# Patient Record
Sex: Male | Born: 1955
Health system: Southern US, Community
[De-identification: ages and names within clinical notes are randomized; demographics above are authoritative.]

## PROBLEM LIST (undated history)

## (undated) DIAGNOSIS — B019 Varicella without complication: Secondary | ICD-10-CM

## (undated) DIAGNOSIS — T7840XA Allergy, unspecified, initial encounter: Secondary | ICD-10-CM

## (undated) HISTORY — DX: Allergy, unspecified, initial encounter: T78.40XA

## (undated) HISTORY — DX: Varicella without complication: B01.9

## (undated) HISTORY — PX: WRIST SURGERY: SHX841

---

## 2009-06-18 ENCOUNTER — Ambulatory Visit: Payer: Self-pay | Admitting: Gastroenterology

## 2009-06-18 LAB — HM COLONOSCOPY

## 2013-08-20 ENCOUNTER — Ambulatory Visit: Payer: Self-pay | Admitting: Podiatry

## 2013-08-29 ENCOUNTER — Ambulatory Visit: Payer: Self-pay | Admitting: Podiatry

## 2014-09-25 DIAGNOSIS — S46211A Strain of muscle, fascia and tendon of other parts of biceps, right arm, initial encounter: Secondary | ICD-10-CM | POA: Insufficient documentation

## 2016-03-09 ENCOUNTER — Ambulatory Visit (INDEPENDENT_AMBULATORY_CARE_PROVIDER_SITE_OTHER): Payer: BLUE CROSS/BLUE SHIELD | Admitting: Family Medicine

## 2016-03-09 ENCOUNTER — Encounter: Payer: Self-pay | Admitting: Family Medicine

## 2016-03-09 VITALS — BP 138/76 | HR 94 | Temp 98.2°F | Ht 70.25 in | Wt 204.0 lb

## 2016-03-09 DIAGNOSIS — M25511 Pain in right shoulder: Secondary | ICD-10-CM

## 2016-03-09 DIAGNOSIS — Z130 Encounter for screening for diseases of the blood and blood-forming organs and certain disorders involving the immune mechanism: Secondary | ICD-10-CM | POA: Diagnosis not present

## 2016-03-09 DIAGNOSIS — Z13220 Encounter for screening for lipoid disorders: Secondary | ICD-10-CM | POA: Diagnosis not present

## 2016-03-09 DIAGNOSIS — M75101 Unspecified rotator cuff tear or rupture of right shoulder, not specified as traumatic: Secondary | ICD-10-CM | POA: Diagnosis not present

## 2016-03-09 DIAGNOSIS — IMO0001 Reserved for inherently not codable concepts without codable children: Secondary | ICD-10-CM

## 2016-03-09 DIAGNOSIS — Z0001 Encounter for general adult medical examination with abnormal findings: Secondary | ICD-10-CM

## 2016-03-09 DIAGNOSIS — Z125 Encounter for screening for malignant neoplasm of prostate: Secondary | ICD-10-CM

## 2016-03-09 DIAGNOSIS — E663 Overweight: Secondary | ICD-10-CM

## 2016-03-09 DIAGNOSIS — Z23 Encounter for immunization: Secondary | ICD-10-CM

## 2016-03-09 DIAGNOSIS — Z1211 Encounter for screening for malignant neoplasm of colon: Secondary | ICD-10-CM

## 2016-03-09 DIAGNOSIS — R030 Elevated blood-pressure reading, without diagnosis of hypertension: Secondary | ICD-10-CM | POA: Diagnosis not present

## 2016-03-09 LAB — COMPREHENSIVE METABOLIC PANEL
ALBUMIN: 4.3 g/dL (ref 3.5–5.2)
ALK PHOS: 60 U/L (ref 39–117)
ALT: 24 U/L (ref 0–53)
AST: 17 U/L (ref 0–37)
BUN: 24 mg/dL — AB (ref 6–23)
CALCIUM: 9.4 mg/dL (ref 8.4–10.5)
CO2: 28 mEq/L (ref 19–32)
Chloride: 108 mEq/L (ref 96–112)
Creatinine, Ser: 1.04 mg/dL (ref 0.40–1.50)
GFR: 77.34 mL/min (ref >60.00)
Glucose, Bld: 79 mg/dL (ref 70–99)
POTASSIUM: 4.5 meq/L (ref 3.5–5.1)
SODIUM: 140 meq/L (ref 135–145)
TOTAL PROTEIN: 6.5 g/dL (ref 6.0–8.3)
Total Bilirubin: 0.4 mg/dL (ref 0.2–1.2)

## 2016-03-09 LAB — LIPID PANEL
CHOLESTEROL: 182 mg/dL (ref 0–200)
HDL: 28.9 mg/dL — ABNORMAL LOW (ref >39.00)
NonHDL: 153.21
TRIGLYCERIDES: 226 mg/dL — AB (ref 0.0–149.0)
Total CHOL/HDL Ratio: 6
VLDL: 45.2 mg/dL — AB (ref 0.0–40.0)

## 2016-03-09 LAB — CBC
HEMATOCRIT: 44.3 % (ref 39.0–52.0)
HEMOGLOBIN: 15.2 g/dL (ref 13.0–17.0)
MCHC: 34.4 g/dL (ref 30.0–36.0)
MCV: 94.4 fl (ref 78.0–100.0)
PLATELETS: 234 10*3/uL (ref 150.0–400.0)
RBC: 4.69 Mil/uL (ref 4.22–5.81)
RDW: 12.9 % (ref 11.5–15.5)
WBC: 10.3 10*3/uL (ref 4.0–10.5)

## 2016-03-09 LAB — HEMOGLOBIN A1C: HEMOGLOBIN A1C: 5.5 % (ref 4.6–6.5)

## 2016-03-09 LAB — PSA: PSA: 0.49 ng/mL (ref 0.10–4.00)

## 2016-03-09 LAB — LDL CHOLESTEROL, DIRECT: LDL DIRECT: 122 mg/dL

## 2016-03-09 MED ORDER — METHYLPREDNISOLONE ACETATE 80 MG/ML IJ SUSP
80.0000 mg | Freq: Once | INTRAMUSCULAR | Status: AC
  Administered 2016-03-09: 80 mg via INTRA_ARTICULAR

## 2016-03-09 NOTE — Patient Instructions (Addendum)
We will call with your lab results.  Your BP is okay at this time.  Follow up annually or sooner if needed.  Health Maintenance, Male A healthy lifestyle and preventative care can promote health and wellness.  Maintain regular health, dental, and eye exams.  Eat a healthy diet. Foods like vegetables, fruits, whole grains, low-fat dairy products, and lean protein foods contain the nutrients you need and are low in calories. Decrease your intake of foods high in solid fats, added sugars, and salt. Get information about a proper diet from your health care provider, if necessary.  Regular physical exercise is one of the most important things you can do for your health. Most adults should get at least 150 minutes of moderate-intensity exercise (any activity that increases your heart rate and causes you to sweat) each week. In addition, most adults need muscle-strengthening exercises on 2 or more days a week.   Maintain a healthy weight. The body mass index (BMI) is a screening tool to identify possible weight problems. It provides an estimate of body fat based on height and weight. Your health care provider can find your BMI and can help you achieve or maintain a healthy weight. For males 20 years and older:  A BMI below 18.5 is considered underweight.  A BMI of 18.5 to 24.9 is normal.  A BMI of 25 to 29.9 is considered overweight.  A BMI of 30 and above is considered obese.  Maintain normal blood lipids and cholesterol by exercising and minimizing your intake of saturated fat. Eat a balanced diet with plenty of fruits and vegetables. Blood tests for lipids and cholesterol should begin at age 30 and be repeated every 5 years. If your lipid or cholesterol levels are high, you are over age 63, or you are at high risk for heart disease, you may need your cholesterol levels checked more frequently.Ongoing high lipid and cholesterol levels should be treated with medicines if diet and exercise are  not working.  If you smoke, find out from your health care provider how to quit. If you do not use tobacco, do not start.  Lung cancer screening is recommended for adults aged 4-80 years who are at high risk for developing lung cancer because of a history of smoking. A yearly low-dose CT scan of the lungs is recommended for people who have at least a 30-pack-year history of smoking and are current smokers or have quit within the past 15 years. A pack year of smoking is smoking an average of 1 pack of cigarettes a day for 1 year (for example, a 30-pack-year history of smoking could mean smoking 1 pack a day for 30 years or 2 packs a day for 15 years). Yearly screening should continue until the smoker has stopped smoking for at least 15 years. Yearly screening should be stopped for people who develop a health problem that would prevent them from having lung cancer treatment.  If you choose to drink alcohol, do not have more than 2 drinks per day. One drink is considered to be 12 oz (360 mL) of beer, 5 oz (150 mL) of wine, or 1.5 oz (45 mL) of liquor.  Avoid the use of street drugs. Do not share needles with anyone. Ask for help if you need support or instructions about stopping the use of drugs.  High blood pressure causes heart disease and increases the risk of stroke. High blood pressure is more likely to develop in:  People who have blood pressure  in the end of the normal range (100-139/85-89 mm Hg).  People who are overweight or obese.  People who are African American.  If you are 88-38 years of age, have your blood pressure checked every 3-5 years. If you are 68 years of age or older, have your blood pressure checked every year. You should have your blood pressure measured twice--once when you are at a hospital or clinic, and once when you are not at a hospital or clinic. Record the average of the two measurements. To check your blood pressure when you are not at a hospital or clinic, you can  use:  An automated blood pressure machine at a pharmacy.  A home blood pressure monitor.  If you are 56-3 years old, ask your health care provider if you should take aspirin to prevent heart disease.  Diabetes screening involves taking a blood sample to check your fasting blood sugar level. This should be done once every 3 years after age 50 if you are at a normal weight and without risk factors for diabetes. Testing should be considered at a younger age or be carried out more frequently if you are overweight and have at least 1 risk factor for diabetes.  Colorectal cancer can be detected and often prevented. Most routine colorectal cancer screening begins at the age of 70 and continues through age 33. However, your health care provider may recommend screening at an earlier age if you have risk factors for colon cancer. On a yearly basis, your health care provider may provide home test kits to check for hidden blood in the stool. A small camera at the end of a tube may be used to directly examine the colon (sigmoidoscopy or colonoscopy) to detect the earliest forms of colorectal cancer. Talk to your health care provider about this at age 9 when routine screening begins. A direct exam of the colon should be repeated every 5-10 years through age 32, unless early forms of precancerous polyps or small growths are found.  People who are at an increased risk for hepatitis B should be screened for this virus. You are considered at high risk for hepatitis B if:  You were born in a country where hepatitis B occurs often. Talk with your health care provider about which countries are considered high risk.  Your parents were born in a high-risk country and you have not received a shot to protect against hepatitis B (hepatitis B vaccine).  You have HIV or AIDS.  You use needles to inject street drugs.  You live with, or have sex with, someone who has hepatitis B.  You are a man who has sex with other  men (MSM).  You get hemodialysis treatment.  You take certain medicines for conditions like cancer, organ transplantation, and autoimmune conditions.  Hepatitis C blood testing is recommended for all people born from [birth year] through 1965 and any individual with known risk factors for hepatitis C.  Healthy men should no longer receive prostate-specific antigen (PSA) blood tests as part of routine cancer screening. Talk to your health care provider about prostate cancer screening.  Testicular cancer screening is not recommended for adolescents or adult males who have no symptoms. Screening includes self-exam, a health care provider exam, and other screening tests. Consult with your health care provider about any symptoms you have or any concerns you have about testicular cancer.  Practice safe sex. Use condoms and avoid high-risk sexual practices to reduce the spread of sexually transmitted infections (STIs).  You should be screened for STIs, including gonorrhea and chlamydia if:  You are sexually active and are younger than 24 years.  You are older than 24 years, and your health care provider tells you that you are at risk for this type of infection.  Your sexual activity has changed since you were last screened, and you are at an increased risk for chlamydia or gonorrhea. Ask your health care provider if you are at risk.  If you are at risk of being infected with HIV, it is recommended that you take a prescription medicine daily to prevent HIV infection. This is called pre-exposure prophylaxis (PrEP). You are considered at risk if:  You are a man who has sex with other men (MSM).  You are a heterosexual man who is sexually active with multiple partners.  You take drugs by injection.  You are sexually active with a partner who has HIV.  Talk with your health care provider about whether you are at high risk of being infected with HIV. If you choose to begin PrEP, you should first be tested  for HIV. You should then be tested every 3 months for as long as you are taking PrEP.  Use sunscreen. Apply sunscreen liberally and repeatedly throughout the day. You should seek shade when your shadow is shorter than you. Protect yourself by wearing long sleeves, pants, a wide-brimmed hat, and sunglasses year round whenever you are outdoors.  Tell your health care provider of new moles or changes in moles, especially if there is a change in shape or color. Also, tell your health care provider if a mole is larger than the size of a pencil eraser.  A one-time screening for abdominal aortic aneurysm (AAA) and surgical repair of large AAAs by ultrasound is recommended for men aged 26-75 years who are current or former smokers.  Stay current with your vaccines (immunizations).   This information is not intended to replace advice given to you by your health care provider. Make sure you discuss any questions you have with your health care provider.   Document Released: 03/25/2008 Document Revised: 10/18/2014 Document Reviewed: 02/22/2011 Elsevier Interactive Patient Education [employer].

## 2016-03-09 NOTE — Progress Notes (Signed)
Pre visit review using our clinic review tool, if applicable. No additional management support is needed unless otherwise documented below in the visit note. 

## 2016-03-10 DIAGNOSIS — Z0000 Encounter for general adult medical examination without abnormal findings: Secondary | ICD-10-CM | POA: Insufficient documentation

## 2016-03-10 DIAGNOSIS — E663 Overweight: Secondary | ICD-10-CM | POA: Insufficient documentation

## 2016-03-10 DIAGNOSIS — M75100 Unspecified rotator cuff tear or rupture of unspecified shoulder, not specified as traumatic: Secondary | ICD-10-CM | POA: Insufficient documentation

## 2016-03-10 DIAGNOSIS — Z0001 Encounter for general adult medical examination with abnormal findings: Secondary | ICD-10-CM | POA: Insufficient documentation

## 2016-03-10 NOTE — Assessment & Plan Note (Signed)
Placing order for colonoscopy. Pneumococcal vaccine given today. Holding off on tetanus immunization given the fact that pneumococcal was given today. Screening labs today including PSA. Advised to quit smoking. Patient is not ready to quit at this time.

## 2016-03-10 NOTE — Progress Notes (Signed)
Subjective:  Patient ID: Gabriel Camacho, male    DOB: Mar 17, 1956  Age: 60 y.o. MRN: [id number]  CC: Establish care; concerned about BP; Right shoulder pain  HPI Gabriel Camacho is a 60 y.o. male presents to the clinic today to establish care.  Preventative Healthcare  Colonoscopy: In need of. Last done 10 years ago.  Immunizations  Tetanus - In need of.   Pneumococcal - In need of.  Flu - N/A at this time.  Zoster - In need of.  Prostate cancer screening: PSA today.  Hepatitis C screening - In need of.  Labs: Screening labs today.  Exercise: Reports regular exercise.  Alcohol use: No.  Smoking/tobacco use: Current smoker.  HIV testing: In need of.  Wears seat belt: Yes.   Elevated BP  Has no diagnosis of hypertension.  States she's had elevated blood pressures in the past 2  He is concerned about this given that he has an upcoming DOT physical.  Shoulder pain, right  New problem.  Recently worsened after significant activity (shoveling).  Pain is moderate in severity.  Located laterally.  Worse with certain movements.  No known relieving factors.  No recent fall, trauma, injury.  PMH, Surgical Hx, Family Hx, Social History reviewed and updated as below.  Past Medical History  Diagnosis Date  . Allergy   . Chicken pox    Past Surgical History  Procedure Laterality Date  . Wrist surgery Left    Family Hx - Does not report any family history.  Social History  Substance Use Topics  . Smoking status: Current Some Day Smoker -- 0.25 packs/day    Types: Cigarettes  . 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 Alcohol Use: No   Review of Systems  Musculoskeletal:       Shoulder pain - Right.  All other systems reviewed and are negative.  Objective:   Today's Vitals: BP 138/76 mmHg  Pulse 94  Temp(Src) 98.2 F (36.8 C) (Oral)  Ht 5' 10.25" (1.784 m)  Wt 204 lb (92.534 kg)   BMI 29.07 kg/m2  SpO2 95%  Physical Exam  Constitutional: He is oriented to person, place, and time. He appears well-developed. No distress.  HENT:  Head: Normocephalic and atraumatic.  Mouth/Throat: Oropharynx is clear and moist.  Eyes: Conjunctivae are normal. No scleral icterus.  Neck: Neck supple.  Cardiovascular: Normal rate and regular rhythm.   No murmur heard. Pulmonary/Chest: Effort normal. He has no wheezes. He has no rales.  Abdominal: Soft. He exhibits no distension. There is no tenderness. There is no rebound and no guarding.  Musculoskeletal:  Shoulder: Right Inspection reveals no abnormalities, atrophy or asymmetry. Palpation is normal with no tenderness over AC joint or bicipital groove. ROM is full in all planes. Rotator cuff strength - 4/5 supraspinatus, infraspinatus/teres minor. 5/5 subscapularis. Negative Hawkins. No painful arc and no drop arm sign. No apprehension sign  Lymphadenopathy:    He has no cervical adenopathy.  Neurological: He is alert and oriented to person, place, and time.  Skin: Skin is warm and dry. No rash noted.  Psychiatric: He has a normal mood and affect.  Vitals reviewed.  Assessment & Plan:   Problem List Items Addressed This Visit    Rotator cuff syndrome    Discussed treatment options today. Patient elected for injection. Injection was performed without difficulty.      Relevant Medications  methylPREDNISolone acetate (DEPO-MEDROL) injection 80 mg (Completed)   Overweight (BMI 25.0-29.9)   Relevant Orders   HgB A1c (Completed)   Encounter for preventative adult health care exam with abnormal findings - Primary    Placing order for colonoscopy. Pneumococcal vaccine given today. Holding off on tetanus immunization given the fact that pneumococcal was given today. Screening labs today including PSA. Advised to quit smoking. Patient is not ready to quit at this time.      Elevated BP    Elevated initially. Repeat blood  pressure was in the prehypertensive range. No indication for treatment at this time.      Relevant Orders   Comp Met (CMET) (Completed)    Other Visit Diagnoses    Screening, lipid        Relevant Orders    Lipid Profile (Completed)    Screening for deficiency anemia        Relevant Orders    CBC (Completed)    Screening for prostate cancer        Relevant Orders    PSA (Completed)    Need for prophylactic vaccination with Streptococcus pneumoniae (Pneumococcus) and Influenza vaccines        Relevant Orders    Pneumococcal polysaccharide vaccine 23-valent greater than or equal to 2yo subcutaneous/IM (Completed)    Encounter for screening colonoscopy        Relevant Orders    Ambulatory referral to Gastroenterology      Outpatient Encounter Prescriptions as of 03/09/2016  Medication Sig  . [EXPIRED] methylPREDNISolone acetate (DEPO-MEDROL) injection 80 mg    No facility-administered encounter medications on file as of 03/09/2016.   Procedure: Right Subacromial bursa injection Consent signed and scanned into record. Medication:  80 mg of Depo medrol, 4 mL of Lidocaine 1% w/o Epi. Preparation: area cleansed with alcohol. Time Out taken  Injection  Landmarks identified Above medication injected using a standard posterior approach. Patient tolerated well without bleeding or paresthesias  Patient had good range of motion of joint after injection  Follow-up: Annually or sooner PRN  [place]

## 2016-03-10 NOTE — Assessment & Plan Note (Signed)
Discussed treatment options today. Patient elected for injection. Injection was performed without difficulty.

## 2016-03-10 NOTE — Assessment & Plan Note (Signed)
Elevated initially. Repeat blood pressure was in the prehypertensive range. No indication for treatment at this time.

## 2016-03-11 ENCOUNTER — Other Ambulatory Visit: Payer: Self-pay | Admitting: Family Medicine

## 2016-03-11 ENCOUNTER — Telehealth: Payer: Self-pay | Admitting: *Deleted

## 2016-03-11 MED ORDER — ATORVASTATIN CALCIUM 40 MG PO TABS: 40.0000 mg | ORAL_TABLET | Freq: Every day | ORAL | Status: DC

## 2016-03-11 NOTE — Telephone Encounter (Signed)
Pharmacy did not receive patients cholesterol medication. Pharmacy [facility]

## 2016-03-11 NOTE — Telephone Encounter (Signed)
Just been super busy. I sent in Rx for lipitor. Recommend OTC Aspirin 81 mg daily as well.

## 2016-03-11 NOTE — Telephone Encounter (Signed)
Please send this in. Patient given verbal okay when he was given labs.

## 2016-03-12 NOTE — Telephone Encounter (Signed)
Patient informed of medication being sent. Pt asked for cholesterol results to be mailed to his house and that was sent.

## 2016-04-12 ENCOUNTER — Encounter: Payer: Self-pay | Admitting: Family Medicine

## 2016-04-12 ENCOUNTER — Ambulatory Visit (INDEPENDENT_AMBULATORY_CARE_PROVIDER_SITE_OTHER): Payer: BLUE CROSS/BLUE SHIELD | Admitting: Family Medicine

## 2016-04-12 ENCOUNTER — Other Ambulatory Visit: Payer: Self-pay

## 2016-04-12 VITALS — BP 144/86 | HR 74 | Temp 97.8°F | Wt 201.0 lb

## 2016-04-12 DIAGNOSIS — I10 Essential (primary) hypertension: Secondary | ICD-10-CM | POA: Diagnosis not present

## 2016-04-12 MED ORDER — LISINOPRIL 10 MG PO TABS: 10.0000 mg | ORAL_TABLET | Freq: Every day | ORAL | Status: DC

## 2016-04-12 NOTE — Progress Notes (Signed)
   Subjective:  Patient ID: Gabriel Camacho, male    DOB: 1955-11-21  Age: 60 y.o. MRN: [id number]  CC: Elevated BP  HPI:  60 year old male with a history of elevated blood pressure but no formal diagnosis of hypertension presents with complaints of elevated blood pressure.  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 As a result he failed his DOT physical. He presents today with complaints of elevated blood pressure. No associated symptoms. He states that his blood pressure is exacerbated by "white coat syndrome". No known relieving factors. No other complaints at this time.  Social Hx   Social History   Social History  . Marital Status: Married    Spouse Name: N/A  . Number of Children: N/A  . Years of Education: N/A   Social History Main Topics  . Smoking status: Current Some Day Smoker -- 0.25 packs/day    Types: Cigarettes  . 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 Alcohol Use: No  . Drug Use: No  . Sexual Activity: Not Asked   Other Topics Concern  . None   Social History Narrative   Review of Systems  Constitutional: Negative.   Respiratory: Negative.   Cardiovascular: Negative.    Objective:  BP 144/86 mmHg  Pulse 74  Temp(Src) 97.8 F (36.6 C) (Oral)  Wt 201 lb (91.173 kg)  SpO2 98%  BP/Weight 04/12/2016 [id number]  Systolic BP 123456 0000000  Diastolic BP 86 76  Wt. (Lbs) 201 204  BMI 28.65 29.07   Physical Exam  Constitutional: He is oriented to person, place, and time. He appears well-developed. No distress.  Cardiovascular: Normal rate and regular rhythm.   Pulmonary/Chest: Effort normal. He has no wheezes. He has no rales.  Neurological: He is alert and oriented to person, place, and time.  Psychiatric: He has a normal mood and affect.  Vitals reviewed.  Lab Results  Component Value Date   WBC 10.3 03/09/2016   HGB 15.2  03/09/2016   HCT 44.3 03/09/2016   PLT 234.0 03/09/2016   GLUCOSE 79 03/09/2016   CHOL 182 03/09/2016   TRIG 226.0* 03/09/2016   HDL 28.90* 03/09/2016   LDLDIRECT 122.0 03/09/2016   ALT 24 03/09/2016   AST 17 03/09/2016   NA 140 03/09/2016   K 4.5 03/09/2016   CL 108 03/09/2016   CREATININE 1.04 03/09/2016   BUN 24* 03/09/2016   CO2 28 03/09/2016   PSA 0.49 03/09/2016   HGBA1C 5.5 03/09/2016   Assessment & Plan:   Problem List Items Addressed This Visit    HTN (hypertension) - Primary    New problem.  Patient with a prior history of elevated blood pressure. Now with hypertension. Starting lisinopril. BMP in 1 week. Blood pressure check in one week as well.      Relevant Medications   lisinopril (PRINIVIL,ZESTRIL) 10 MG tablet     Meds ordered this encounter  Medications  . lisinopril (PRINIVIL,ZESTRIL) 10 MG tablet    Sig: Take 1 tablet (10 mg total) by mouth daily.    Dispense:  90 tablet    Refill:  3   Follow-up: Return in about 1 week (around 04/19/2016) for BP check - Nurse visit.  [place]

## 2016-04-12 NOTE — Assessment & Plan Note (Signed)
New problem.  Patient with a prior history of elevated blood pressure. Now with hypertension. Starting lisinopril. BMP in 1 week. Blood pressure check in one week as well.

## 2016-04-12 NOTE — Patient Instructions (Signed)
Take the medication daily.  Follow up in 1 week for a nurse visit for BP check and to get labs.  Take care  Dr. Lacinda Axon

## 2016-04-20 ENCOUNTER — Other Ambulatory Visit (INDEPENDENT_AMBULATORY_CARE_PROVIDER_SITE_OTHER): Payer: BLUE CROSS/BLUE SHIELD

## 2016-04-20 DIAGNOSIS — I10 Essential (primary) hypertension: Secondary | ICD-10-CM

## 2016-04-21 ENCOUNTER — Encounter: Payer: Self-pay | Admitting: Family Medicine

## 2016-04-21 ENCOUNTER — Ambulatory Visit (INDEPENDENT_AMBULATORY_CARE_PROVIDER_SITE_OTHER): Payer: BLUE CROSS/BLUE SHIELD | Admitting: Family Medicine

## 2016-04-21 VITALS — BP 130/78 | HR 86 | Temp 98.2°F | Wt 200.5 lb

## 2016-04-21 DIAGNOSIS — I10 Essential (primary) hypertension: Secondary | ICD-10-CM | POA: Diagnosis not present

## 2016-04-21 LAB — BASIC METABOLIC PANEL
BUN: 21 mg/dL (ref 6–23)
CHLORIDE: 106 meq/L (ref 96–112)
CO2: 25 mEq/L (ref 19–32)
Calcium: 9.3 mg/dL (ref 8.4–10.5)
Creatinine, Ser: 1.15 mg/dL (ref 0.40–1.50)
GFR: 68.84 mL/min (ref >60.00)
GLUCOSE: 86 mg/dL (ref 70–99)
POTASSIUM: 4.3 meq/L (ref 3.5–5.1)
Sodium: 139 mEq/L (ref 135–145)

## 2016-04-21 MED ORDER — DIAZEPAM 10 MG PO TABS: ORAL_TABLET | ORAL | Status: DC

## 2016-04-21 NOTE — Progress Notes (Signed)
Pre visit review using our clinic review tool, if applicable. No additional management support is needed unless otherwise documented below in the visit note. 

## 2016-04-21 NOTE — Patient Instructions (Signed)
Labs - 330 on 7/19.  Follow up annually or sooner if needed  Take care  Dr. Lacinda Axon

## 2016-04-22 MED ORDER — LISINOPRIL 20 MG PO TABS: 20.0000 mg | ORAL_TABLET | Freq: Every day | ORAL | Status: DC

## 2016-04-22 NOTE — Progress Notes (Signed)
Subjective:  Patient ID: Gabriel Camacho, male    DOB: [birth year]  Age: 60 y.o. MRN: [id number]  CC: Follow up HTN  HPI:  60 year old male presents for follow up regarding HTN.  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  I recently placed him on lisinopril.  He presents today for follow-up regarding his hypertension.  He states that his blood pressure is right at goal when taken at home (140/90).  However, he is quite concerned as every time he goes to the doctor's office his blood pressures elevated as it is today.  He would like to discuss this as he is concerned about failing his DOT physical yet again.  He endorses compliance with lisinopril.  No reported side effects.  Social Hx   Social History   Social History  . Marital Status: Married    Spouse Name: N/A  . Number of Children: N/A  . Years of Education: N/A   Social History Main Topics  . Smoking status: Current Some Day Smoker -- 0.25 packs/day    Types: Cigarettes  . 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 Alcohol Use: No  . Drug Use: No  . Sexual Activity: Not Asked   Other Topics Concern  . None   Social History Narrative   Review of Systems  Constitutional: Negative.   Respiratory: Negative.   Cardiovascular: Negative.    Objective:  BP 130/78 mmHg  Pulse 86  Temp(Src) 98.2 F (36.8 C) (Oral)  Wt 200 lb 8 oz (90.946 kg)  SpO2 95%  BP/Weight 04/21/2016 04/12/2016 Q000111Q  Systolic BP [id number] 0000000  Diastolic BP 78 86 76  Wt. (Lbs) 200.5 201 204  BMI 28.58 28.65 29.07   Physical Exam  Constitutional: He is oriented to person, place, and time. He appears well-developed. No distress.  Cardiovascular: Normal rate and regular rhythm.   Pulmonary/Chest: Effort normal. He has no wheezes. He has no rales.  Neurological: He is alert and oriented to person, place, and time.    Psychiatric: He has a normal mood and affect.  Vitals reviewed.  Lab Results  Component Value Date   WBC 10.3 03/09/2016   HGB 15.2 03/09/2016   HCT 44.3 03/09/2016   PLT 234.0 03/09/2016   GLUCOSE 86 04/20/2016   CHOL 182 03/09/2016   TRIG 226.0* 03/09/2016   HDL 28.90* 03/09/2016   LDLDIRECT 122.0 03/09/2016   ALT 24 03/09/2016   AST 17 03/09/2016   NA 139 04/20/2016   K 4.3 04/20/2016   CL 106 04/20/2016   CREATININE 1.15 04/20/2016   BUN 21 04/20/2016   CO2 25 04/20/2016   PSA 0.49 03/09/2016   HGBA1C 5.5 03/09/2016   Assessment & Plan:   Problem List Items Addressed This Visit    HTN (hypertension) - Primary    BP elevated today. Improved after CMA repeat. However, I repeated it in the room and it was still elevated (SBP 156). Increasing Lisinopril to 20 mg daily. Suspect significant component of white coat HTN. To my knowledge nobody performs ambulatory blood pressure monitoring in our area. Valium prior to DOT physicial (signficant anxiety regarding this).      Relevant Medications   lisinopril (PRINIVIL,ZESTRIL) 20 MG tablet     Meds ordered this encounter  Medications  . diazepam (VALIUM) 10 MG tablet    Sig:  1 tablet prior to procedure/visit.    Dispense:  1 tablet    Refill:  0  . lisinopril (PRINIVIL,ZESTRIL) 20 MG tablet    Sig: Take 1 tablet (20 mg total) by mouth daily.    Dispense:  90 tablet    Refill:  3    Follow-up: Pending home BP readings  [place]

## 2016-04-22 NOTE — Assessment & Plan Note (Signed)
BP elevated today. Improved after CMA repeat. However, I repeated it in the room and it was still elevated (SBP 156). Increasing Lisinopril to 20 mg daily. Suspect significant component of white coat HTN. To my knowledge nobody performs ambulatory blood pressure monitoring in our area. Valium prior to DOT physicial (signficant anxiety regarding this).

## 2016-04-27 ENCOUNTER — Other Ambulatory Visit: Payer: Self-pay | Admitting: Family Medicine

## 2016-04-27 ENCOUNTER — Telehealth: Payer: Self-pay

## 2016-04-27 DIAGNOSIS — I10 Essential (primary) hypertension: Secondary | ICD-10-CM

## 2016-04-27 NOTE — Telephone Encounter (Signed)
Pt coming for lab 04/28/16. Please place future orders. Thank you.

## 2016-04-28 ENCOUNTER — Other Ambulatory Visit (INDEPENDENT_AMBULATORY_CARE_PROVIDER_SITE_OTHER): Payer: BLUE CROSS/BLUE SHIELD

## 2016-04-28 DIAGNOSIS — I10 Essential (primary) hypertension: Secondary | ICD-10-CM

## 2016-04-29 LAB — BASIC METABOLIC PANEL
BUN: 21 mg/dL (ref 6–23)
CHLORIDE: 105 meq/L (ref 96–112)
CO2: 25 meq/L (ref 19–32)
Calcium: 9.6 mg/dL (ref 8.4–10.5)
Creatinine, Ser: 1.17 mg/dL (ref 0.40–1.50)
GFR: 67.48 mL/min (ref >60.00)
GLUCOSE: 90 mg/dL (ref 70–99)
POTASSIUM: 4.4 meq/L (ref 3.5–5.1)
Sodium: 138 mEq/L (ref 135–145)

## 2016-06-16 ENCOUNTER — Telehealth: Payer: Self-pay | Admitting: Family Medicine

## 2016-06-16 ENCOUNTER — Ambulatory Visit (INDEPENDENT_AMBULATORY_CARE_PROVIDER_SITE_OTHER): Payer: BLUE CROSS/BLUE SHIELD | Admitting: Family Medicine

## 2016-06-16 DIAGNOSIS — I10 Essential (primary) hypertension: Secondary | ICD-10-CM

## 2016-06-16 MED ORDER — HYDROCHLOROTHIAZIDE 25 MG PO TABS: 25.0000 mg | ORAL_TABLET | Freq: Every day | ORAL | 0 refills | Status: DC

## 2016-06-16 MED ORDER — DIAZEPAM 10 MG PO TABS: ORAL_TABLET | ORAL | 0 refills | Status: DC

## 2016-06-16 NOTE — Telephone Encounter (Signed)
Pt came in stating if he can get another (1) magic pill he was not able to finish out the appt for today due to documentation. Pt has to have it done within 2 weeks. Please advise?  [id number] - [place], [place]  Call pt @ [phone].  Thank you!

## 2016-06-16 NOTE — Telephone Encounter (Signed)
Left message for patient to return call back.  

## 2016-06-16 NOTE — Patient Instructions (Signed)
Continue the lisinopril.  Add the HCTZ.  Valium prior to visit.  Take care  Dr. Lacinda Axon

## 2016-06-16 NOTE — Progress Notes (Signed)
Pre visit review using our clinic review tool, if applicable. No additional management support is needed unless otherwise documented below in the visit note. 

## 2016-06-17 NOTE — Assessment & Plan Note (Signed)
Established problem, worsening. Adding HCTZ. Continue Lisinopril. Valium prior to DOT physical (patient has component of white coat HTN).

## 2016-06-17 NOTE — Telephone Encounter (Signed)
Patient had appointment yesterday and received valium

## 2016-06-17 NOTE — Progress Notes (Signed)
Subjective:  Patient ID: Gabriel Camacho, male    DOB: [birth year]  Age: 60 y.o. MRN: [id number]  CC: F/U HTN  HPI:  60 year old male with HTN, HLD presents for follow up on HTN.  HTN  Worsening.  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   He checked his BP at home and it was elevated ([id number] systolic).  He feels fine otherwise. No symptoms.  Compliant with Lisinopril.  He would like to discuss this today.  Social Hx   Social History   Social History  . Marital status: Married    Spouse name: N/A  . Number of children: N/A  . Years of education: N/A   Social History Main Topics  . Smoking status: Current Some Day Smoker    Packs/day: 0.25    Types: Cigarettes  . 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 Alcohol use No  . Drug use: No  . Sexual activity: Not on file   Other Topics Concern  . Not on file   Social History Narrative  . No narrative on file   Review of Systems  Constitutional: Negative.   Cardiovascular:       Elevated BP.   Objective:  BP (!) 156/98 (BP Location: Left Arm, Patient Position: Sitting, Cuff Size: Normal)   Pulse 79   Temp 97.9 F (36.6 C) (Oral)   Wt 205 lb (93 kg)   SpO2 97%   BMI 29.21 kg/m   BP/Weight 06/16/2016 [id number]  Systolic BP [id number]  Diastolic BP 98 78 86  Wt. (Lbs) 205 200.5 201  BMI 29.21 28.58 28.65    Physical Exam  Constitutional: He is oriented to person, place, and time. He appears well-developed. No distress.  HENT:  Head: Normocephalic and atraumatic.  Eyes: Conjunctivae are normal. No scleral icterus.  Pulmonary/Chest: Effort normal.  Neurological: He is alert and oriented to person, place, and time.  Psychiatric: He has a normal mood and affect.  Vitals reviewed.  Lab Results  Component Value Date   WBC 10.3 03/09/2016   HGB 15.2 03/09/2016   HCT 44.3 03/09/2016   PLT 234.0 03/09/2016   GLUCOSE 90 04/28/2016   CHOL 182 03/09/2016   TRIG 226.0 (H) 03/09/2016   HDL 28.90 (L) 03/09/2016   LDLDIRECT 122.0 03/09/2016   ALT 24 03/09/2016   AST 17 03/09/2016   NA 138 04/28/2016   K 4.4 04/28/2016   CL 105 04/28/2016   CREATININE 1.17 04/28/2016   BUN 21 04/28/2016   CO2 25 04/28/2016   PSA 0.49 03/09/2016   HGBA1C 5.5 03/09/2016    Assessment & Plan:   Problem List Items Addressed This Visit    HTN (hypertension)    Established problem, worsening. Adding HCTZ. Continue Lisinopril. Valium prior to DOT physical (patient has component of white coat HTN).      Relevant Medications   hydrochlorothiazide (HYDRODIURIL) 25 MG tablet    Other Visit Diagnoses   None.     Meds ordered this encounter  Medications  . hydrochlorothiazide (HYDRODIURIL) 25 MG tablet    Sig: Take 1 tablet (25 mg total) by mouth daily.    Dispense:  90 tablet    Refill:  0  . diazepam (VALIUM) 10 MG tablet    Sig: 1 tablet prior to procedure/visit.    Dispense:  1 tablet  Refill:  0   Follow-up: PRN  [place]

## 2016-09-11 ENCOUNTER — Other Ambulatory Visit: Payer: Self-pay | Admitting: Family Medicine

## 2016-12-23 ENCOUNTER — Ambulatory Visit (INDEPENDENT_AMBULATORY_CARE_PROVIDER_SITE_OTHER): Payer: BLUE CROSS/BLUE SHIELD | Admitting: Family Medicine

## 2016-12-23 ENCOUNTER — Encounter: Payer: Self-pay | Admitting: Family Medicine

## 2016-12-23 DIAGNOSIS — S29011A Strain of muscle and tendon of front wall of thorax, initial encounter: Secondary | ICD-10-CM

## 2016-12-23 NOTE — Progress Notes (Signed)
Pre visit review using our clinic review tool, if applicable. No additional management support is needed unless otherwise documented below in the visit note. 

## 2016-12-24 DIAGNOSIS — S29011A Strain of muscle and tendon of front wall of thorax, initial encounter: Secondary | ICD-10-CM | POA: Insufficient documentation

## 2016-12-24 NOTE — Assessment & Plan Note (Signed)
New problem. Patient's chest pain appears to be from an intercostal muscle strain. No indication that this is cardiac in etiology. Advised decrease in activity briefly and over-the-counter Tylenol/ibuprofen as needed.

## 2016-12-24 NOTE — Progress Notes (Signed)
   Subjective:  Patient ID: Gabriel Camacho, male    DOB: 02/02/1956  Age: 61 y.o. MRN: [id number]  CC: Right-sided chest pain  HPI:  61 year old male with a history of hypertension presents with the above complaint.  Patient has had intermittent right-sided chest pain for the past 1-2 weeks. It is located in a focal spot on the right chest. Occurs with cough predominantly. No shortness of breath. No diaphoresis. No other associated symptoms. Mild in severity. No known relieving factors. No other complaints or concerns at this time.  Of note, patient has been working out more frequently recently. He's been doing pushups and other activities on a home gym.  Social Hx   Social History   Social History  . Marital status: Married    Spouse name: N/A  . Number of children: N/A  . Years of education: N/A   Social History Main Topics  . Smoking status: Current Some Day Smoker    Packs/day: 0.25    Types: Cigarettes  . 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 Alcohol use No  . Drug use: No  . Sexual activity: Not Asked   Other Topics Concern  . None   Social History Narrative  . None    Review of Systems  Constitutional: Negative.   Cardiovascular: Positive for chest pain.   Objective:  BP (!) 162/100   Pulse 98   Temp 98.2 F (36.8 C) (Oral)   Wt 209 lb 6 oz (95 kg)   SpO2 95%   BMI 29.83 kg/m   BP/Weight 12/23/2016 06/16/2016 9/67/5916  Systolic BP 384 665 993  Diastolic BP 570 98 78  Wt. (Lbs) 209.38 205 200.5  BMI 29.83 29.21 28.58    Physical Exam  Constitutional: He is oriented to person, place, and time. He appears well-developed. No distress.  Cardiovascular: Normal rate and regular rhythm.   Pulmonary/Chest: Effort normal and breath sounds normal. He exhibits tenderness.  Neurological: He is alert and oriented to person, place, and time.  Psychiatric: He has a normal mood and affect.  Vitals  reviewed.   Lab Results  Component Value Date   WBC 10.3 03/09/2016   HGB 15.2 03/09/2016   HCT 44.3 03/09/2016   PLT 234.0 03/09/2016   GLUCOSE 90 04/28/2016   CHOL 182 03/09/2016   TRIG 226.0 (H) 03/09/2016   HDL 28.90 (L) 03/09/2016   LDLDIRECT 122.0 03/09/2016   ALT 24 03/09/2016   AST 17 03/09/2016   NA 138 04/28/2016   K 4.4 04/28/2016   CL 105 04/28/2016   CREATININE 1.17 04/28/2016   BUN 21 04/28/2016   CO2 25 04/28/2016   PSA 0.49 03/09/2016   HGBA1C 5.5 03/09/2016    Assessment & Plan:   Problem List Items Addressed This Visit    Intercostal muscle strain    New problem. Patient's chest pain appears to be from an intercostal muscle strain. No indication that this is cardiac in etiology. Advised decrease in activity briefly and over-the-counter Tylenol/ibuprofen as needed.        Follow-up: PRN  [place]

## 2017-02-07 ENCOUNTER — Ambulatory Visit: Payer: BLUE CROSS/BLUE SHIELD | Admitting: Family Medicine

## 2017-02-20 ENCOUNTER — Other Ambulatory Visit: Payer: Self-pay | Admitting: Family Medicine

## 2017-04-29 ENCOUNTER — Other Ambulatory Visit: Payer: Self-pay | Admitting: Family Medicine

## 2017-09-06 ENCOUNTER — Other Ambulatory Visit: Payer: Self-pay | Admitting: Family Medicine

## 2017-09-07 ENCOUNTER — Encounter: Payer: Self-pay | Admitting: *Deleted

## 2017-09-12 ENCOUNTER — Telehealth: Payer: Self-pay | Admitting: Family Medicine

## 2017-09-12 MED ORDER — HYDROCHLOROTHIAZIDE 25 MG PO TABS: 25.0000 mg | ORAL_TABLET | Freq: Every day | ORAL | 0 refills | Status: DC

## 2017-09-12 NOTE — Telephone Encounter (Signed)
Last refill 09/23/16 x 1 year.  Appt made 11/28/17 @ 1600 for OV will refill until next visit

## 2017-09-12 NOTE — Telephone Encounter (Signed)
Copied from [place] [id number]. Topic: General - Other >> Sep 12, 2017  2:24 PM Darl Householder, RMA wrote: Reason for CRM: Medication refill request for hydrochlorothiazide 25 mg to be sent to [facility] [address]

## 2017-11-28 ENCOUNTER — Encounter (INDEPENDENT_AMBULATORY_CARE_PROVIDER_SITE_OTHER): Payer: Self-pay

## 2017-11-28 ENCOUNTER — Ambulatory Visit: Payer: BLUE CROSS/BLUE SHIELD | Admitting: Family Medicine

## 2017-11-28 ENCOUNTER — Other Ambulatory Visit: Payer: Self-pay

## 2017-11-28 ENCOUNTER — Encounter: Payer: Self-pay | Admitting: Family Medicine

## 2017-11-28 VITALS — BP 128/86 | HR 83 | Temp 97.5°F | Wt 207.6 lb

## 2017-11-28 DIAGNOSIS — E663 Overweight: Secondary | ICD-10-CM | POA: Diagnosis not present

## 2017-11-28 DIAGNOSIS — I10 Essential (primary) hypertension: Secondary | ICD-10-CM | POA: Diagnosis not present

## 2017-11-28 DIAGNOSIS — Z23 Encounter for immunization: Secondary | ICD-10-CM | POA: Diagnosis not present

## 2017-11-28 DIAGNOSIS — Z1159 Encounter for screening for other viral diseases: Secondary | ICD-10-CM | POA: Diagnosis not present

## 2017-11-28 DIAGNOSIS — R238 Other skin changes: Secondary | ICD-10-CM | POA: Insufficient documentation

## 2017-11-28 DIAGNOSIS — E785 Hyperlipidemia, unspecified: Secondary | ICD-10-CM | POA: Diagnosis not present

## 2017-11-28 NOTE — Assessment & Plan Note (Signed)
Normal-appearing facial skin today.  Letter provided asking his job to allow him to not have to shave.

## 2017-11-28 NOTE — Patient Instructions (Signed)
Nice to see you. Please continue exercise. Please check your blood pressure daily for the next week and call us with your results.

## 2017-11-28 NOTE — Assessment & Plan Note (Signed)
Improved on recheck.  He will monitor at home and call us in 1 week with his numbers. Continue current regimen.

## 2017-11-28 NOTE — Assessment & Plan Note (Signed)
Check lipid panel.  Continue Lipitor. 

## 2017-11-28 NOTE — Progress Notes (Signed)
  Tommi Rumps, MD Phone: [phone]  Gabriel Camacho is a 62 y.o. male who presents today for follow-up.  HYPERTENSION  Disease Monitoring  Home BP Monitoring 120s/80s Chest pain- no    Dyspnea- no Medications  Compliance-  Taking hctz, lisinopril.   Edema- no  Hyperlipidemia: Taking Lipitor.  No right upper quadrant pain or myalgias.  Overweight: He has been watching his diet more recently.  Eating more fruits and snack foods.  Walks at the mall daily.  He does some weight training.  Patient does note intermittent facial skin irritation with shaving.  He has to stay shaved for work unless he has a note from his physician.  He notes no current issues with this.  Social History   Tobacco Use  Smoking Status Current Some Day Smoker  . Packs/day: 0.25  . Types: Cigarettes  Smokeless Tobacco Never Used  Tobacco Comment   smokes 2 packs a week. 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  Cardiovascular: Normal rate, regular rhythm and normal heart sounds.  Pulmonary/Chest: Effort normal and breath sounds normal.  Musculoskeletal: He exhibits no edema.  Neurological: He is alert. Gait normal.  Skin: Skin is warm and dry. He is not diaphoretic.     Assessment/Plan: Please see individual problem list.  HTN (hypertension) Improved on recheck.  He will monitor at home and call us in 1 week with his numbers. Continue current regimen.  HLD (hyperlipidemia) Check lipid panel.  Continue Lipitor.  Overweight (BMI  25.0-29.9) Encouraged continued diet and exercise.  Skin irritation Normal-appearing facial skin today.  Letter provided asking his job to allow him to not have to shave.   Orders Placed This Encounter  Procedures  . Tdap vaccine greater than or equal to 7yo IM  . Comp Met (CMET)  . Lipid panel  . Hepatitis C Antibody  . HgB A1c    No orders of the defined types were placed in this encounter.    Tommi Rumps, MD [place]

## 2017-11-28 NOTE — Assessment & Plan Note (Signed)
Encouraged continued diet and exercise. 

## 2017-11-29 ENCOUNTER — Encounter: Payer: Self-pay | Admitting: Family Medicine

## 2017-11-29 LAB — COMPREHENSIVE METABOLIC PANEL
ALT: 32 U/L (ref 0–53)
AST: 23 U/L (ref 0–37)
Albumin: 4.3 g/dL (ref 3.5–5.2)
Alkaline Phosphatase: 58 U/L (ref 39–117)
BUN: 15 mg/dL (ref 6–23)
CHLORIDE: 104 meq/L (ref 96–112)
CO2: 28 meq/L (ref 19–32)
Calcium: 9.4 mg/dL (ref 8.4–10.5)
Creatinine, Ser: 1.1 mg/dL (ref 0.40–1.50)
GFR: 72.08 mL/min (ref >60.00)
GLUCOSE: 118 mg/dL — AB (ref 70–99)
Potassium: 3.9 mEq/L (ref 3.5–5.1)
Sodium: 138 mEq/L (ref 135–145)
Total Bilirubin: 0.5 mg/dL (ref 0.2–1.2)
Total Protein: 7.1 g/dL (ref 6.0–8.3)

## 2017-11-29 LAB — LIPID PANEL
CHOL/HDL RATIO: 5
Cholesterol: 134 mg/dL (ref 0–200)
HDL: 29.6 mg/dL — AB (ref >39.00)
LDL CALC: 66 mg/dL (ref 0–99)
NONHDL: 104.75
Triglycerides: 194 mg/dL — ABNORMAL HIGH (ref 0.0–149.0)
VLDL: 38.8 mg/dL (ref 0.0–40.0)

## 2017-11-29 LAB — HEPATITIS C ANTIBODY
Hepatitis C Ab: NONREACTIVE
SIGNAL TO CUT-OFF: 0.01 (ref <1.00)

## 2017-11-29 LAB — HEMOGLOBIN A1C: HEMOGLOBIN A1C: 5.7 % (ref 4.6–6.5)

## 2017-12-01 ENCOUNTER — Telehealth: Payer: Self-pay

## 2017-12-01 NOTE — Telephone Encounter (Signed)
Received Colonoscopy records. Informed patient he does not need another colonoscopy until 2020. Patient verbalized understanding.

## 2017-12-05 ENCOUNTER — Other Ambulatory Visit: Payer: Self-pay

## 2017-12-05 MED ORDER — HYDROCHLOROTHIAZIDE 25 MG PO TABS: 25.0000 mg | ORAL_TABLET | Freq: Every day | ORAL | 0 refills | Status: DC

## 2018-01-17 ENCOUNTER — Other Ambulatory Visit: Payer: Self-pay | Admitting: Family Medicine

## 2018-01-18 NOTE — Telephone Encounter (Signed)
Last OV 11/28/17 last filled by Dr.Cook 02/21/17 90 0rf

## 2018-02-27 ENCOUNTER — Other Ambulatory Visit: Payer: Self-pay

## 2018-02-27 ENCOUNTER — Encounter: Payer: Self-pay | Admitting: Family Medicine

## 2018-02-27 ENCOUNTER — Ambulatory Visit: Payer: BLUE CROSS/BLUE SHIELD | Admitting: Family Medicine

## 2018-02-27 VITALS — BP 140/80 | HR 80 | Temp 98.1°F | Ht 70.0 in | Wt 206.6 lb

## 2018-02-27 DIAGNOSIS — E785 Hyperlipidemia, unspecified: Secondary | ICD-10-CM

## 2018-02-27 DIAGNOSIS — Z87898 Personal history of other specified conditions: Secondary | ICD-10-CM | POA: Insufficient documentation

## 2018-02-27 DIAGNOSIS — I10 Essential (primary) hypertension: Secondary | ICD-10-CM | POA: Diagnosis not present

## 2018-02-27 DIAGNOSIS — R7303 Prediabetes: Secondary | ICD-10-CM | POA: Diagnosis not present

## 2018-02-27 NOTE — Patient Instructions (Signed)
Nice to see you. Please continue to monitor your diet and stay active. We will check lab work today and contact you with results.

## 2018-02-27 NOTE — Assessment & Plan Note (Signed)
Last A1c 5.7.  We will continue to work on dietary changes and exercise.  Recheck A1c today.

## 2018-02-27 NOTE — Progress Notes (Signed)
  Tommi Rumps, MD Phone: [phone]  Gabriel Camacho is a 62 y.o. male who presents today for f/u.  CC: HTN, HLD, prediabetes  HYPERTENSION  Disease Monitoring  Home BP Monitoring 110-130/60-70s Chest pain- no    Dyspnea- no Medications  Compliance-  Taking HCTZ, lisinopril.  Edema- no  HYPERLIPIDEMIA Symptoms Chest pain on exertion:  no   Leg claudication:   no Medications: Compliance- taking lipitor Right upper quadrant pain- no  Muscle aches- no  Prediabetes: Notes no polyuria or polydipsia.  He is eating much more fruit than he was previously.  Takes a sandwich for lunch.  Also walking daily.  Gets about 20 minutes of strength training several days a week.    Social History   Tobacco Use  Smoking Status Former Smoker  . Packs/day: 0.25  . Types: Cigarettes  Smokeless Tobacco Never Used  Tobacco Comment   smokes 2 packs a week. 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  Cardiovascular: Normal rate, regular rhythm and normal heart sounds.  Pulmonary/Chest: Effort normal and breath sounds normal.  Musculoskeletal: He exhibits no edema.  Neurological: He is alert.  Skin: Skin is warm and dry. He is not diaphoretic.     Assessment/Plan: Please see individual problem list.  HTN (hypertension) Well-controlled at home.  Continue current regimen.  BMP today.  HLD (hyperlipidemia) Most recent lipid panel well controlled.  Continue Lipitor.  Prediabetes Last A1c 5.7.  We will continue to work on dietary changes and exercise.  Recheck  A1c today.   Orders Placed This Encounter  Procedures  . Basic Metabolic Panel (BMET)  . HgB A1c    No orders of the defined types were placed in this encounter.    Tommi Rumps, MD [place]

## 2018-02-27 NOTE — Assessment & Plan Note (Signed)
Well-controlled at home.  Continue current regimen.  BMP today.

## 2018-02-27 NOTE — Assessment & Plan Note (Signed)
Most recent lipid panel well controlled.  Continue Lipitor.

## 2018-02-28 LAB — HEMOGLOBIN A1C: HEMOGLOBIN A1C: 5.7 % (ref 4.6–6.5)

## 2018-02-28 LAB — BASIC METABOLIC PANEL
BUN: 17 mg/dL (ref 6–23)
CO2: 27 mEq/L (ref 19–32)
CREATININE: 1.17 mg/dL (ref 0.40–1.50)
Calcium: 9.1 mg/dL (ref 8.4–10.5)
Chloride: 105 mEq/L (ref 96–112)
GFR: 67.07 mL/min (ref >60.00)
GLUCOSE: 111 mg/dL — AB (ref 70–99)
Potassium: 4.3 mEq/L (ref 3.5–5.1)
SODIUM: 139 meq/L (ref 135–145)

## 2018-03-01 ENCOUNTER — Other Ambulatory Visit: Payer: Self-pay | Admitting: Family Medicine

## 2018-04-16 ENCOUNTER — Other Ambulatory Visit: Payer: Self-pay | Admitting: Family Medicine

## 2018-04-18 NOTE — Telephone Encounter (Signed)
Last OV 02/27/18 last filled by Dr.Cook 04/29/17 90 3rf

## 2018-06-01 ENCOUNTER — Other Ambulatory Visit: Payer: Self-pay | Admitting: Family Medicine

## 2018-09-02 ENCOUNTER — Other Ambulatory Visit: Payer: Self-pay | Admitting: Family Medicine

## 2018-09-04 ENCOUNTER — Ambulatory Visit: Payer: BLUE CROSS/BLUE SHIELD | Admitting: Family Medicine

## 2018-09-04 ENCOUNTER — Encounter: Payer: Self-pay | Admitting: Family Medicine

## 2018-09-04 VITALS — BP 138/78 | HR 90 | Temp 97.7°F | Ht 70.0 in | Wt 215.4 lb

## 2018-09-04 DIAGNOSIS — E785 Hyperlipidemia, unspecified: Secondary | ICD-10-CM

## 2018-09-04 DIAGNOSIS — R7303 Prediabetes: Secondary | ICD-10-CM | POA: Diagnosis not present

## 2018-09-04 DIAGNOSIS — I10 Essential (primary) hypertension: Secondary | ICD-10-CM

## 2018-09-04 NOTE — Progress Notes (Signed)
  Tommi Rumps, MD Phone: [phone]  Gabriel Camacho is a 62 y.o. male who presents today for follow-up.  CC: Hypertension, hyperlipidemia, prediabetes  Hypertension: Last checked and was 120/75.  Taking HCTZ and lisinopril.  No chest pain, shortness of breath, or edema.  Hyperlipidemia: Taking Lipitor.  No right upper quadrant pain or myalgias.  Prediabetes: No polydipsia.  Occasional nocturia.  This is been stable for years.  No straining with urination.  He does empty his bladder.  No polyuria during the day.  He walks and lifts weights for exercise.  He eats fairly healthfully.  Tries to limit his unhealthy food.  Social History   Tobacco Use  Smoking Status Former Smoker  . Packs/day: 0.25  . Types: Cigarettes  Smokeless Tobacco Never Used  Tobacco Comment   smokes 2 packs a week. 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  Cardiovascular: Normal rate, regular rhythm and normal heart sounds.  Pulmonary/Chest: Effort normal and breath sounds normal.  Musculoskeletal: He exhibits no edema.  Neurological: He is alert.  Skin: Skin is warm and dry. He is not diaphoretic.     Assessment/Plan: Please see individual problem list.  HTN (hypertension) Adequately controlled.  We will continue his current medication.  He will return for lab work.  HLD (hyperlipidemia) Continue Lipitor.  Prediabetes Check A1c.  Continue diet and exercise.    Orders Placed This Encounter  Procedures  . Comp Met (CMET)    Standing  Status:   Future    Standing Expiration Date:   09/05/2019  . HgB A1c    Standing Status:   Future    Standing Expiration Date:   09/05/2019    No orders of the defined types were placed in this encounter.    Tommi Rumps, MD [place]

## 2018-09-04 NOTE — Assessment & Plan Note (Signed)
-  Continue Lipitor °

## 2018-09-04 NOTE — Assessment & Plan Note (Signed)
Check A1c.  Continue diet and exercise. 

## 2018-09-04 NOTE — Patient Instructions (Signed)
Nice to see you. We will have you return later this week for labs.

## 2018-09-04 NOTE — Assessment & Plan Note (Signed)
Adequately controlled.  We will continue his current medication.  He will return for lab work.

## 2018-09-05 ENCOUNTER — Other Ambulatory Visit (INDEPENDENT_AMBULATORY_CARE_PROVIDER_SITE_OTHER): Payer: BLUE CROSS/BLUE SHIELD

## 2018-09-05 DIAGNOSIS — R7303 Prediabetes: Secondary | ICD-10-CM | POA: Diagnosis not present

## 2018-09-05 DIAGNOSIS — I10 Essential (primary) hypertension: Secondary | ICD-10-CM | POA: Diagnosis not present

## 2018-09-05 DIAGNOSIS — Z23 Encounter for immunization: Secondary | ICD-10-CM | POA: Diagnosis not present

## 2018-09-05 LAB — COMPREHENSIVE METABOLIC PANEL
ALT: 38 U/L (ref 0–53)
AST: 26 U/L (ref 0–37)
Albumin: 4.2 g/dL (ref 3.5–5.2)
Alkaline Phosphatase: 58 U/L (ref 39–117)
BILIRUBIN TOTAL: 0.6 mg/dL (ref 0.2–1.2)
BUN: 27 mg/dL — ABNORMAL HIGH (ref 6–23)
CALCIUM: 9.1 mg/dL (ref 8.4–10.5)
CO2: 26 mEq/L (ref 19–32)
CREATININE: 1.1 mg/dL (ref 0.40–1.50)
Chloride: 104 mEq/L (ref 96–112)
GFR: 71.9 mL/min (ref >60.00)
Glucose, Bld: 149 mg/dL — ABNORMAL HIGH (ref 70–99)
Potassium: 4.3 mEq/L (ref 3.5–5.1)
Sodium: 137 mEq/L (ref 135–145)
Total Protein: 7 g/dL (ref 6.0–8.3)

## 2018-09-05 LAB — HEMOGLOBIN A1C: HEMOGLOBIN A1C: 5.8 % (ref 4.6–6.5)

## 2018-09-05 NOTE — Addendum Note (Signed)
Addended by: Arby Barrette on: 09/05/2018 02:23 PM   Modules accepted: Orders

## 2018-09-11 ENCOUNTER — Telehealth: Payer: Self-pay | Admitting: Family Medicine

## 2018-09-11 NOTE — Telephone Encounter (Signed)
Copied from [place] [id number]. Topic: Quick Communication - Lab Results (Clinic Use ONLY) >> Sep 11, 2018  2:30 PM [place] wrote: Called patient to inform them of  lab results. When patient returns call, triage nurse may disclose results.  Patient can be reached at # below [phone]

## 2018-09-11 NOTE — Telephone Encounter (Signed)
Results given and documented in result note. 

## 2018-12-21 ENCOUNTER — Other Ambulatory Visit: Payer: Self-pay | Admitting: Family Medicine

## 2019-01-17 ENCOUNTER — Other Ambulatory Visit: Payer: Self-pay | Admitting: Family Medicine

## 2019-03-06 ENCOUNTER — Ambulatory Visit (INDEPENDENT_AMBULATORY_CARE_PROVIDER_SITE_OTHER): Payer: BLUE CROSS/BLUE SHIELD | Admitting: Family Medicine

## 2019-03-06 ENCOUNTER — Other Ambulatory Visit: Payer: Self-pay

## 2019-03-06 ENCOUNTER — Encounter: Payer: Self-pay | Admitting: Family Medicine

## 2019-03-06 DIAGNOSIS — I10 Essential (primary) hypertension: Secondary | ICD-10-CM | POA: Diagnosis not present

## 2019-03-06 DIAGNOSIS — E785 Hyperlipidemia, unspecified: Secondary | ICD-10-CM | POA: Diagnosis not present

## 2019-03-06 DIAGNOSIS — R7303 Prediabetes: Secondary | ICD-10-CM | POA: Diagnosis not present

## 2019-03-06 NOTE — Progress Notes (Signed)
Virtual Visit via video Note  This visit type was conducted due to national recommendations for restrictions regarding the COVID-19 pandemic (e.g. social distancing).  This format is felt to be most appropriate for this patient at this time.  All issues noted in this document were discussed and addressed.  No physical exam was performed (except for noted visual exam findings with Video Visits).   I connected with Gabriel Camacho today at 11:00 AM EDT by a video enabled telemedicine application and verified that I am speaking with the correct person using two identifiers. Location patient: home Location provider: work Persons participating in the virtual visit: patient, provider  I discussed the limitations, risks, security and privacy concerns of performing an evaluation and management service by telephone and the availability of in person appointments. I also discussed with the patient that there may be a patient responsible charge related to this service. The patient expressed understanding and agreed to proceed.  Reason for visit: follow-up  HPI: Hypertension: Typically 110-136 over 50s.  Taking HCTZ and lisinopril.  No chest pain, shortness breath, or edema.  Occasionally will get lightheaded when he bends over and then stands up.  No syncope.  Hyperlipidemia: Taking Lipitor.  No right upper quadrant pain or myalgias.  Prediabetes: No polyuria or polydipsia.  He notes he is eating mostly healthy and watching labels.  He walks and does push-ups and curls a number of days a week.   ROS: See pertinent positives and negatives per HPI.  Past Medical History:  Diagnosis Date  . Allergy   . Chicken pox     Past Surgical History:  Procedure Laterality Date  . WRIST SURGERY Left     No family history on file.  SOCIAL HX: Former smoker.   Current Outpatient Medications:  .  atorvastatin (LIPITOR) 40 MG tablet, TAKE 1 TABLET BY MOUTH EVERY DAY, Disp: 90 tablet, Rfl: 3 .   hydrochlorothiazide (HYDRODIURIL) 25 MG tablet, TAKE 1 TABLET BY MOUTH EVERY DAY, Disp: 90 tablet, Rfl: 0 .  lisinopril (PRINIVIL,ZESTRIL) 20 MG tablet, TAKE 1 TABLET BY MOUTH EVERY DAY, Disp: 90 tablet, Rfl: 1  EXAM:  VITALS per patient if applicable: None.  GENERAL: alert, oriented, appears well and in no acute distress  HEENT: atraumatic, conjunttiva clear, no obvious abnormalities on inspection of external nose and ears  NECK: normal movements of the head and neck  LUNGS: on inspection no signs of respiratory distress, breathing rate appears normal, no obvious gross SOB, gasping or wheezing  CV: no obvious cyanosis  MS: moves all visible extremities without noticeable abnormality  PSYCH/NEURO: pleasant and cooperative, no obvious depression or anxiety, speech and thought processing grossly intact  ASSESSMENT AND PLAN:  Discussed the following assessment and plan:  Essential hypertension  Hyperlipidemia, unspecified hyperlipidemia type - Plan: Lipid panel, Comprehensive metabolic panel  Prediabetes - Plan: Hemoglobin A1c  HTN (hypertension) Adequately controlled.  Continue current regimen.  He will come in for lab work.  HLD (hyperlipidemia) We will continue Lipitor.  Prediabetes Check A1c.  Continue diet and exercise.  CMA will contact patient to schedule follow-up in 6 months and lab work in July.  Social distancing precautions and sick precautions given regarding COVID-19.   I discussed the assessment and treatment plan with the patient. The patient was provided an opportunity to ask questions and all were answered. The patient agreed with the plan and demonstrated an understanding of the instructions.   The patient was advised to call back or seek  an in-person evaluation if the symptoms worsen or if the condition fails to improve as anticipated.   Tommi Rumps, MD

## 2019-03-08 ENCOUNTER — Telehealth: Payer: Self-pay | Admitting: Family Medicine

## 2019-03-08 NOTE — Telephone Encounter (Signed)
Please call the patient and get him scheduled for labs sometime the week of July 4.  Please also get him scheduled for follow-up in 6 months.  Thanks.

## 2019-03-08 NOTE — Assessment & Plan Note (Signed)
Check A1c.  Continue diet and exercise. 

## 2019-03-08 NOTE — Assessment & Plan Note (Signed)
Adequately controlled.  Continue current regimen.  He will come in for lab work.

## 2019-03-08 NOTE — Assessment & Plan Note (Signed)
We will continue Lipitor.

## 2019-03-13 ENCOUNTER — Other Ambulatory Visit: Payer: Self-pay | Admitting: Family Medicine

## 2019-04-11 ENCOUNTER — Other Ambulatory Visit (INDEPENDENT_AMBULATORY_CARE_PROVIDER_SITE_OTHER): Payer: BC Managed Care – PPO

## 2019-04-11 ENCOUNTER — Other Ambulatory Visit: Payer: Self-pay

## 2019-04-11 DIAGNOSIS — E785 Hyperlipidemia, unspecified: Secondary | ICD-10-CM | POA: Diagnosis not present

## 2019-04-11 DIAGNOSIS — R7303 Prediabetes: Secondary | ICD-10-CM | POA: Diagnosis not present

## 2019-04-11 LAB — COMPREHENSIVE METABOLIC PANEL
ALT: 31 U/L (ref 0–53)
AST: 21 U/L (ref 0–37)
Albumin: 4.3 g/dL (ref 3.5–5.2)
Alkaline Phosphatase: 70 U/L (ref 39–117)
BUN: 24 mg/dL — ABNORMAL HIGH (ref 6–23)
CO2: 24 mEq/L (ref 19–32)
Calcium: 8.6 mg/dL (ref 8.4–10.5)
Chloride: 107 mEq/L (ref 96–112)
Creatinine, Ser: 1.19 mg/dL (ref 0.40–1.50)
GFR: 61.66 mL/min (ref >60.00)
Glucose, Bld: 117 mg/dL — ABNORMAL HIGH (ref 70–99)
Potassium: 4.2 mEq/L (ref 3.5–5.1)
Sodium: 140 mEq/L (ref 135–145)
Total Bilirubin: 0.8 mg/dL (ref 0.2–1.2)
Total Protein: 6.6 g/dL (ref 6.0–8.3)

## 2019-04-11 LAB — HEMOGLOBIN A1C: Hgb A1c MFr Bld: 5.6 % (ref 4.6–6.5)

## 2019-04-11 LAB — LIPID PANEL
Cholesterol: 117 mg/dL (ref 0–200)
HDL: 30.2 mg/dL — ABNORMAL LOW (ref >39.00)
LDL Cholesterol: 69 mg/dL (ref 0–99)
NonHDL: 86.94
Total CHOL/HDL Ratio: 4
Triglycerides: 90 mg/dL (ref 0.0–149.0)
VLDL: 18 mg/dL (ref 0.0–40.0)

## 2019-06-12 ENCOUNTER — Other Ambulatory Visit: Payer: Self-pay | Admitting: Family Medicine

## 2019-08-15 ENCOUNTER — Other Ambulatory Visit: Payer: Self-pay | Admitting: Family Medicine

## 2019-09-29 ENCOUNTER — Other Ambulatory Visit: Payer: Self-pay | Admitting: Family Medicine

## 2019-10-15 ENCOUNTER — Ambulatory Visit: Payer: BC Managed Care – PPO | Admitting: Family Medicine

## 2019-11-12 ENCOUNTER — Other Ambulatory Visit: Payer: Self-pay

## 2019-11-12 ENCOUNTER — Ambulatory Visit (INDEPENDENT_AMBULATORY_CARE_PROVIDER_SITE_OTHER): Payer: BC Managed Care – PPO | Admitting: Family Medicine

## 2019-11-12 ENCOUNTER — Encounter: Payer: Self-pay | Admitting: Family Medicine

## 2019-11-12 VITALS — Ht 70.0 in | Wt 207.0 lb

## 2019-11-12 DIAGNOSIS — I10 Essential (primary) hypertension: Secondary | ICD-10-CM | POA: Diagnosis not present

## 2019-11-12 DIAGNOSIS — Z1211 Encounter for screening for malignant neoplasm of colon: Secondary | ICD-10-CM

## 2019-11-12 DIAGNOSIS — R7303 Prediabetes: Secondary | ICD-10-CM | POA: Diagnosis not present

## 2019-11-12 DIAGNOSIS — E785 Hyperlipidemia, unspecified: Secondary | ICD-10-CM | POA: Diagnosis not present

## 2019-11-12 NOTE — Assessment & Plan Note (Signed)
A1c previously improved.  Plan to recheck.  Encouraged healthy diet and remaining active.

## 2019-11-12 NOTE — Assessment & Plan Note (Signed)
-  Continue Lipitor °

## 2019-11-12 NOTE — Assessment & Plan Note (Signed)
Adequately controlled.  Continue current regimen.  He will come in for lab work.

## 2019-11-12 NOTE — Progress Notes (Signed)
Virtual Visit via telephone Note  This visit type was conducted due to national recommendations for restrictions regarding the COVID-19 pandemic (e.g. social distancing).  This format is felt to be most appropriate for this patient at this time.  All issues noted in this document were discussed and addressed.  No physical exam was performed (except for noted visual exam findings with Video Visits).   I connected with Gabriel Camacho Monday today at  3:30 PM EST by telephone and verified that I am speaking with the correct person using two identifiers. Location patient: home Location provider: work  Persons participating in the virtual visit: patient, provider  I discussed the limitations, risks, security and privacy concerns of performing an evaluation and management service by telephone and the availability of in person appointments. I also discussed with the patient that there may be a patient responsible charge related to this service. The patient expressed understanding and agreed to proceed.  Interactive audio and video telecommunications were attempted between this provider and patient, however failed, due to patient having technical difficulties OR patient did not have access to video capability.  We continued and completed visit with audio only.   Reason for visit: follow-up  HPI: HYPERTENSION  Disease Monitoring  Home BP Monitoring 125-137/80-85 Chest pain- no    Dyspnea- no Medications  Compliance-  Taking lisinopril, HCTZ. Lightheadedness-  Yes, when he bends over and stands up quickly. Does not occur often. Resolves within seconds. No syncope. Does not drink a lot of water specifically. Drinks sugar free gatorade. Edema- no  HYPERLIPIDEMIA Symptoms Chest pain on exertion:  no    Medications: Compliance- taking lipitor Right upper quadrant pain- no  Muscle aches- no  Prediabetes: exercises by walking at home and doing exercises at home. Gets lots of steps through work. Diet with  fruits daily. No fried foods. No soda or sweet tea.       ROS: See pertinent positives and negatives per HPI.  Past Medical History:  Diagnosis Date  . Allergy   . Chicken pox     Past Surgical History:  Procedure Laterality Date  . WRIST SURGERY Left     No family history on file.  SOCIAL HX: former smoker   Current Outpatient Medications:  .  atorvastatin (LIPITOR) 40 MG tablet, TAKE 1 TABLET BY MOUTH EVERY DAY, Disp: 90 tablet, Rfl: 3 .  hydrochlorothiazide (HYDRODIURIL) 25 MG tablet, TAKE 1 TABLET BY MOUTH EVERY DAY, Disp: 90 tablet, Rfl: 0 .  lisinopril (ZESTRIL) 20 MG tablet, TAKE 1 TABLET BY MOUTH EVERY DAY, Disp: 90 tablet, Rfl: 1  EXAM: This was a telehealth telephone visit and thus no physical exam was completed.  ASSESSMENT AND PLAN:  Discussed the following assessment and plan:  HTN (hypertension) Adequately controlled.  Continue current regimen.  He will come in for lab work.  HLD (hyperlipidemia) Continue Lipitor.  Prediabetes A1c previously improved.  Plan to recheck.  Encouraged healthy diet and remaining active.   Orders Placed This Encounter  Procedures  . Cologuard  Cologuard ordered for colon cancer screening.  Patient denies history of colon polyps.  Notes no family history of colon cancer.  No blood in his stool.  I did discuss that if it was negative he would be good for 3 years on colon cancer screening.  If it is positive he has to have a colonoscopy.  Discussed that his insurance may not pay for the colonoscopy given that it would no longer be diagnostic if the Cologuard  was positive.  No orders of the defined types were placed in this encounter.    I discussed the assessment and treatment plan with the patient. The patient was provided an opportunity to ask questions and all were answered. The patient agreed with the plan and demonstrated an understanding of the instructions.   The patient was advised to call back or seek an in-person  evaluation if the symptoms worsen or if the condition fails to improve as anticipated.  I provided 11 minutes of non-face-to-face time during this encounter.   Tommi Rumps, MD

## 2019-11-22 ENCOUNTER — Telehealth: Payer: Self-pay | Admitting: Family Medicine

## 2019-11-22 DIAGNOSIS — I10 Essential (primary) hypertension: Secondary | ICD-10-CM

## 2019-11-22 DIAGNOSIS — E785 Hyperlipidemia, unspecified: Secondary | ICD-10-CM

## 2019-11-22 NOTE — Telephone Encounter (Signed)
Pt would like to have lab work done. Basic panel. Please advise.

## 2019-11-22 NOTE — Telephone Encounter (Signed)
Ordered

## 2019-11-22 NOTE — Telephone Encounter (Signed)
Patient was seen on 11/12/19 an he is requesting lab work he want the BMP.  Rafaela Dinius,cma

## 2019-11-22 NOTE — Telephone Encounter (Signed)
Can you please call and schedule a lab appt. For the patient the labs are ordered.  Alija Riano,cma

## 2019-11-26 ENCOUNTER — Other Ambulatory Visit: Payer: BC Managed Care – PPO

## 2019-12-03 ENCOUNTER — Other Ambulatory Visit: Payer: BC Managed Care – PPO

## 2019-12-26 ENCOUNTER — Other Ambulatory Visit: Payer: Self-pay | Admitting: Family Medicine

## 2020-01-09 LAB — COLOGUARD: Cologuard: NEGATIVE

## 2020-01-15 ENCOUNTER — Other Ambulatory Visit: Payer: Self-pay | Admitting: Family Medicine

## 2020-02-26 ENCOUNTER — Other Ambulatory Visit: Payer: Self-pay | Admitting: Family Medicine

## 2020-03-11 ENCOUNTER — Telehealth: Payer: Self-pay | Admitting: Family Medicine

## 2020-03-11 ENCOUNTER — Other Ambulatory Visit: Payer: Self-pay

## 2020-03-11 ENCOUNTER — Encounter: Payer: Self-pay | Admitting: Family Medicine

## 2020-03-11 ENCOUNTER — Other Ambulatory Visit (INDEPENDENT_AMBULATORY_CARE_PROVIDER_SITE_OTHER): Payer: BC Managed Care – PPO

## 2020-03-11 DIAGNOSIS — I10 Essential (primary) hypertension: Secondary | ICD-10-CM

## 2020-03-11 DIAGNOSIS — E785 Hyperlipidemia, unspecified: Secondary | ICD-10-CM | POA: Diagnosis not present

## 2020-03-11 LAB — BASIC METABOLIC PANEL
BUN: 25 mg/dL — ABNORMAL HIGH (ref 6–23)
CO2: 25 mEq/L (ref 19–32)
Calcium: 8.9 mg/dL (ref 8.4–10.5)
Chloride: 105 mEq/L (ref 96–112)
Creatinine, Ser: 1.04 mg/dL (ref 0.40–1.50)
GFR: 71.82 mL/min (ref >60.00)
Glucose, Bld: 116 mg/dL — ABNORMAL HIGH (ref 70–99)
Potassium: 4.5 mEq/L (ref 3.5–5.1)
Sodium: 138 mEq/L (ref 135–145)

## 2020-03-11 LAB — LDL CHOLESTEROL, DIRECT: Direct LDL: 83 mg/dL

## 2020-03-11 NOTE — Telephone Encounter (Signed)
Patient was in the office for labs today and wanted to know if his colorguard test came back. No one called him about it.

## 2020-03-11 NOTE — Telephone Encounter (Signed)
Called and informed the patient's wife that his cologuard results was negative.  Jeralyn Nolden,cma

## 2020-03-12 ENCOUNTER — Other Ambulatory Visit: Payer: BC Managed Care – PPO

## 2020-03-24 ENCOUNTER — Other Ambulatory Visit: Payer: Self-pay | Admitting: Family Medicine

## 2020-08-20 ENCOUNTER — Other Ambulatory Visit: Payer: Self-pay | Admitting: Family Medicine

## 2020-08-25 ENCOUNTER — Other Ambulatory Visit: Payer: Self-pay | Admitting: Family Medicine

## 2020-10-13 ENCOUNTER — Ambulatory Visit: Admit: 2020-10-13 | Disposition: A | Discharge status: Home / Self Care | Payer: BC Managed Care – PPO

## 2020-10-17 ENCOUNTER — Other Ambulatory Visit: Payer: Self-pay

## 2020-10-17 ENCOUNTER — Ambulatory Visit (INDEPENDENT_AMBULATORY_CARE_PROVIDER_SITE_OTHER): Payer: BC Managed Care – PPO

## 2020-10-17 ENCOUNTER — Ambulatory Visit: Payer: BC Managed Care – PPO | Admitting: Internal Medicine

## 2020-10-17 ENCOUNTER — Encounter: Payer: Self-pay | Admitting: Internal Medicine

## 2020-10-17 VITALS — BP 126/70 | HR 86 | Temp 97.8°F | Ht 70.0 in | Wt 214.0 lb

## 2020-10-17 DIAGNOSIS — I10 Essential (primary) hypertension: Secondary | ICD-10-CM

## 2020-10-17 DIAGNOSIS — R079 Chest pain, unspecified: Secondary | ICD-10-CM | POA: Insufficient documentation

## 2020-10-17 DIAGNOSIS — H6123 Impacted cerumen, bilateral: Secondary | ICD-10-CM

## 2020-10-17 DIAGNOSIS — Z1329 Encounter for screening for other suspected endocrine disorder: Secondary | ICD-10-CM

## 2020-10-17 DIAGNOSIS — R0789 Other chest pain: Secondary | ICD-10-CM

## 2020-10-17 DIAGNOSIS — Z1389 Encounter for screening for other disorder: Secondary | ICD-10-CM

## 2020-10-17 DIAGNOSIS — Z125 Encounter for screening for malignant neoplasm of prostate: Secondary | ICD-10-CM

## 2020-10-17 LAB — TROPONIN I: Troponin I: 30 ng/L (ref <=47)

## 2020-10-17 MED ORDER — METHYLPREDNISOLONE ACETATE 40 MG/ML IJ SUSP
40.0000 mg | Freq: Once | INTRAMUSCULAR | Status: AC
  Administered 2020-10-17: 40 mg via INTRAMUSCULAR

## 2020-10-17 NOTE — Patient Instructions (Addendum)
voltaren/diclofenac gel 4x per day   Debrox ear wax drops discuss wax in ears with Dr. Caryl Bis or visit [facility] [address]   Debrox ear wax drops otc 5-10 drops each ear let sit x 5-10 minutes with the ear up and do this for 1 week and nina can flush ears at your next appointment or using ear flush warm water and hydrogen peroxide    Nonspecific Chest Pain, Adult Chest pain can be caused by many different conditions. It can be caused by a condition that is life-threatening and requires treatment right away. It can also be caused by something that is not life-threatening. If you have chest pain, it can be hard to know the difference, so it is important to get help right away to make sure that you do not have a serious condition. Some life-threatening causes of chest pain include:  Heart attack.  A tear in the body's main blood vessel (aortic dissection).  Inflammation around your heart (pericarditis).  A problem in the lungs, such as a blood clot (pulmonary embolism) or a collapsed lung (pneumothorax). Some non life-threatening causes of chest pain include:  Heartburn.  Anxiety or stress.  Damage to the bones, muscles, and cartilage that make up your chest wall.  Pneumonia or bronchitis.  Shingles infection (varicella-zoster virus). Chest pain can feel like:  Pain or discomfort on the surface of your chest or deep in your chest.  Crushing, pressure, aching, or squeezing pain.  Burning or tingling.  Dull or sharp pain that is worse when you move, cough, or take a deep breath.  Pain or discomfort that is also felt in your back, neck, jaw, shoulder, or arm, or pain that spreads to any of these areas. Your chest pain may come and go. It may also be constant. Your health care provider will do lab tests and other studies to find the cause of your pain. Treatment will depend on the cause of your chest pain. Follow these instructions at home: Medicines  Take  over-the-counter and prescription medicines only as told by your health care provider.  If you were prescribed an antibiotic, take it as told by your health care provider. Do not stop taking the antibiotic even if you start to feel better. Lifestyle   Rest as directed by your health care provider.  Do not use any products that contain nicotine or tobacco, such as cigarettes and e-cigarettes. If you need help quitting, ask your health care provider.  Do not drink alcohol.  Make healthy lifestyle choices as recommended. These may include: ? Getting regular exercise. Ask your health care provider to suggest some activities that are safe for you. ? Eating a heart-healthy diet. This includes plenty of fresh fruits and vegetables, whole grains, low-fat (lean) protein, and low-fat dairy products. A dietitian can help you find healthy eating options. ? Maintaining a healthy weight. ? Managing any other health conditions you have, such as high blood pressure (hypertension) or diabetes. ? Reducing stress, such as with yoga or relaxation techniques. General instructions  Pay attention to any changes in your symptoms. Tell your health care provider about them or any new symptoms.  Avoid any activities that cause chest pain.  Keep all follow-up visits as told by your health care provider. This is important. This includes visits for any further testing if your chest pain does not go away. Contact a health care provider if:  Your chest pain does not go away.  You feel depressed.  You have a fever. Get help right away if:  Your chest pain gets worse.  You have a cough that gets worse, or you cough up blood.  You have severe pain in your abdomen.  You faint.  You have sudden, unexplained chest discomfort.  You have sudden, unexplained discomfort in your arms, back, neck, or jaw.  You have shortness of breath at any time.  You suddenly start to sweat, or your skin gets clammy.  You  feel nausea or you vomit.  You suddenly feel lightheaded or dizzy.  You have severe weakness, or unexplained weakness or fatigue.  Your heart begins to beat quickly, or it feels like it is skipping beats. These symptoms may represent a serious problem that is an emergency. Do not wait to see if the symptoms will go away. Get medical help right away. Call your local emergency services (911 in the [place] Do not drive yourself to the hospital. Summary  Chest pain can be caused by a condition that is serious and requires urgent treatment. It may also be caused by something that is not life-threatening.  If you have chest pain, it is very important to see your health care provider. Your health care provider may do lab tests and other studies to find the cause of your pain.  Follow your health care provider's instructions on taking medicines, making lifestyle changes, and getting emergency treatment if symptoms become worse.  Keep all follow-up visits as told by your health care provider. This includes visits for any further testing if your chest pain does not go away. This information is not intended to replace advice given to you by your health care provider. Make sure you discuss any questions you have with your health care provider. Document Revised: 03/30/2018 Document Reviewed: 03/30/2018 Elsevier Patient Education  [place].  Chest Wall Pain Chest wall pain is pain in or around the bones and muscles of your chest. Sometimes, an injury causes this pain. Excessive coughing or overuse of arm and chest muscles may also cause chest wall pain. Sometimes, the cause may not be known. This pain may take several weeks or longer to get better. Follow these instructions at home: Managing pain, stiffness, and swelling   If directed, put ice on the painful area: ? Put ice in a plastic bag. ? Place a towel between your skin and the bag. ? Leave the ice on for 20 minutes, 2-3 times per  day. Activity  Rest as told by your health care provider.  Avoid activities that cause pain. These include any activities that use your chest muscles or your abdominal and side muscles to lift heavy items. Ask your health care provider what activities are safe for you. General instructions   Take over-the-counter and prescription medicines only as told by your health care provider.  Do not use any products that contain nicotine or tobacco, such as cigarettes, e-cigarettes, and chewing tobacco. These can delay healing after injury. If you need help quitting, ask your health care provider.  Keep all follow-up visits as told by your health care provider. This is important. Contact a health care provider if:  You have a fever.  Your chest pain becomes worse.  You have new symptoms. Get help right away if:  You have nausea or vomiting.  You feel sweaty or light-headed.  You have a cough with mucus from your lungs (sputum) or you cough up blood.  You develop shortness of breath. These symptoms may represent a  serious problem that is an emergency. Do not wait to see if the symptoms will go away. Get medical help right away. Call your local emergency services (911 in the [place] Do not drive yourself to the hospital. Summary  Chest wall pain is pain in or around the bones and muscles of your chest.  Depending on the cause, it may be treated with ice, rest, medicines, and avoiding activities that cause pain.  Contact a health care provider if you have a fever, worsening chest pain, or new symptoms.  Get help right away if you feel light-headed or you develop shortness of breath. These symptoms may be an emergency. This information is not intended to replace advice given to you by your health care provider. Make sure you discuss any questions you have with your health care provider. Document Revised: 03/30/2018 Document Reviewed: 03/30/2018 Elsevier Patient Education  [place].  Costochondritis  Costochondritis is swelling and irritation (inflammation) of the tissue (cartilage) that connects your ribs to your breastbone (sternum). This causes pain in the front of your chest. The pain usually starts gradually and involves more than one rib. What are the causes? The exact cause of this condition is not always known. It results from stress on the cartilage where your ribs attach to your sternum. The cause of this stress could be:  Chest injury (trauma).  Exercise or activity, such as lifting.  Severe coughing. What increases the risk? You may be at higher risk for this condition if you:  Are male.  Are 28?65 years old.  Recently started a new exercise or work activity.  Have low levels of vitamin D.  Have a condition that makes you cough frequently. What are the signs or symptoms? The main symptom of this condition is chest pain. The pain:  Usually starts gradually and can be sharp or dull.  Gets worse with deep breathing, coughing, or exercise.  Gets better with rest.  May be worse when you press on the sternum-rib connection (tenderness). How is this diagnosed? This condition is diagnosed based on your symptoms, medical history, and a physical exam. Your health care provider will check for tenderness when pressing on your sternum. This is the most important finding. You may also have tests to rule out other causes of chest pain. These may include:  A chest X-ray to check for lung problems.  An electrocardiogram (ECG) to see if you have a heart problem that could be causing the pain.  An imaging scan to rule out a chest or rib fracture. How is this treated? This condition usually goes away on its own over time. Your health care provider may prescribe an NSAID to reduce pain and inflammation. Your health care provider may also suggest that you:  Rest and avoid activities that make pain worse.  Apply heat or cold to the area to reduce pain  and inflammation.  Do exercises to stretch your chest muscles. If these treatments do not help, your health care provider may inject a numbing medicine at the sternum-rib connection to help relieve the pain. Follow these instructions at home:  Avoid activities that make pain worse. This includes any activities that use chest, abdominal, and side muscles.  If directed, put ice on the painful area: ? Put ice in a plastic bag. ? Place a towel between your skin and the bag. ? Leave the ice on for 20 minutes, 2-3 times a day.  If directed, apply heat to the affected area as  often as told by your health care provider. Use the heat source that your health care provider recommends, such as a moist heat pack or a heating pad. ? Place a towel between your skin and the heat source. ? Leave the heat on for 20-30 minutes. ? Remove the heat if your skin turns bright red. This is especially important if you are unable to feel pain, heat, or cold. You may have a greater risk of getting burned.  Take over-the-counter and prescription medicines only as told by your health care provider.  Return to your normal activities as told by your health care provider. Ask your health care provider what activities are safe for you.  Keep all follow-up visits as told by your health care provider. This is important. Contact a health care provider if:  You have chills or a fever.  Your pain does not go away or it gets worse.  You have a cough that does not go away (is persistent). Get help right away if:  You have shortness of breath. This information is not intended to replace advice given to you by your health care provider. Make sure you discuss any questions you have with your health care provider. Document Revised: 10/12/2017 Document Reviewed: 01/21/2016 Elsevier Patient Education  2020 [employer].

## 2020-10-17 NOTE — Progress Notes (Signed)
Chief Complaint  Patient presents with  . Muscle Pain    Onset a week ago at work. 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 Pain started that night in the left side upper chest/shoulder area.    F/u  1. 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 No rad of chest pain left arm/jaw but comes on and off 6/10 with lifting hands above head sx's improved  Tried Tylenol icy hot, heating pad ? If helps sxs come and go  No Fh Cardiac disease    Review of Systems  Constitutional: Negative for weight loss.  HENT: Positive for hearing loss.   Eyes: Negative for blurred vision.  Respiratory: Negative for shortness of breath.   Cardiovascular: Positive for chest pain.  Gastrointestinal: Negative for abdominal pain, nausea and vomiting.  Musculoskeletal: Negative for falls and joint pain.  Skin: Negative for rash.  Neurological: Negative for dizziness.   Past Medical History:  Diagnosis Date  . Allergy   . Chicken pox    Past Surgical History:  Procedure Laterality Date  . WRIST SURGERY Left    No family history on file. Social History   Socioeconomic History  . Marital status: Married    Spouse name: Not on file  . Number of children: Not on file  . Years of education: Not on file  . Highest education level: Not on file  Occupational History  . Not on file  Tobacco Use  . Smoking status: Former Smoker    Packs/day: 0.25    Types: Cigarettes  . Smokeless tobacco: Never Used  . Tobacco comment: smokes 2 packs a week. 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 Alcohol use: No    Alcohol/week: 0.0 standard drinks  . Drug use: No  . Sexual activity: Not on file  Other Topics Concern  . Not on file  Social History Narrative  . Not on file    Social Determinants of Health   Financial Resource Strain: Not on file  Food Insecurity: Not on file  Transportation Needs: Not on file  Physical Activity: Not on file  Stress: Not on file  Social Connections: Not on file  Intimate Partner Violence: Not on file   Current Meds  Medication Sig  . aspirin EC 81 MG tablet Take 81 mg by mouth daily. Swallow whole.  Marland Kitchen atorvastatin (LIPITOR) 40 MG tablet TAKE 1 TABLET BY MOUTH EVERY DAY  . hydrochlorothiazide (HYDRODIURIL) 25 MG tablet TAKE 1 TABLET BY MOUTH EVERY DAY  . lisinopril (ZESTRIL) 20 MG tablet TAKE 1 TABLET BY MOUTH EVERY DAY   No Known Allergies No results found for this or any previous visit (from the past 2160 hour(s)). Objective  Body mass index is 30.71 kg/m. Wt Readings from Last 3 Encounters:  10/17/20 214 lb (97.1 kg)  11/12/19 207 lb (93.9 kg)  09/04/18 215 lb 6.4 oz (97.7 kg)   Temp Readings from Last 3 Encounters:  10/17/20 97.8 F (36.6 C) (Oral)  09/04/18 97.7 F (36.5 C) (Oral)  02/27/18 98.1 F (36.7 C) (Oral)   BP Readings from Last 3 Encounters:  10/17/20 126/70  09/04/18 138/78  02/27/18 140/80   Pulse Readings from Last 3 Encounters:  10/17/20  86  09/04/18 90  02/27/18 80    Physical Exam Vitals and nursing note reviewed.  Constitutional:      Appearance: Normal appearance. He is well-developed and well-groomed.  HENT:     Head: Normocephalic and atraumatic.     Right Ear: There is impacted cerumen.     Left Ear: There is impacted cerumen.  Eyes:     Conjunctiva/sclera: Conjunctivae normal.     Pupils: Pupils are equal, round, and reactive to light.  Cardiovascular:     Rate and Rhythm: Normal rate and regular rhythm.     Heart sounds: Normal heart sounds. No murmur heard.   Pulmonary:     Effort: Pulmonary effort is normal.     Breath sounds: Normal breath sounds.  Chest:     Chest wall: No tenderness.     Comments: CP not reproducible  Skin:    General: Skin is warm  and dry.  Neurological:     General: No focal deficit present.     Mental Status: He is alert and oriented to person, place, and time. Mental status is at baseline.     Gait: Gait normal.  Psychiatric:        Attention and Perception: Attention and perception normal.        Mood and Affect: Mood and affect normal.        Speech: Speech normal.        Behavior: Behavior normal. Behavior is cooperative.        Thought Content: Thought content normal.        Cognition and Memory: Cognition and memory normal.        Judgment: Judgment normal.     Assessment  Plan  Left side atypical Chest pain ? Stable angina vs MSK- Plan: EKG 12-Lead NSR normal today, Troponin I -, DG Chest 2 View Lis 20 mg qd, lipitor 40 mg qhs hctz 25 aspirin 81 mg daily If msk consider voltaren gel otc 4x per day depomedrol 40 mg x 1 today for suspected MSK [facility] or [facility] if worsening    Bilateral impacted cerumen  rec debrox disc with PCP F/u in 2 weeks  Provider: Dr. Olivia Mackie [facility]

## 2020-10-18 LAB — URINALYSIS, ROUTINE W REFLEX MICROSCOPIC
Bilirubin, UA: NEGATIVE
Glucose, UA: NEGATIVE
Ketones, UA: NEGATIVE
Leukocytes,UA: NEGATIVE
Nitrite, UA: NEGATIVE
Protein,UA: NEGATIVE
RBC, UA: NEGATIVE
Specific Gravity, UA: 1.017 (ref 1.005–1.030)
Urobilinogen, Ur: 0.2 mg/dL (ref 0.2–1.0)
pH, UA: 6 (ref 5.0–7.5)

## 2020-10-18 LAB — LIPID PANEL
Cholesterol: 146 mg/dL (ref <200)
HDL: 35 mg/dL — ABNORMAL LOW (ref >=40)
LDL Cholesterol (Calc): 87 mg/dL (calc)
Non-HDL Cholesterol (Calc): 111 mg/dL (calc) (ref <130)
Total CHOL/HDL Ratio: 4.2 (calc) (ref <5.0)
Triglycerides: 139 mg/dL (ref <150)

## 2020-10-18 LAB — CBC WITH DIFFERENTIAL/PLATELET
Absolute Monocytes: 931 cells/uL (ref 200–950)
Basophils Absolute: 66 cells/uL (ref 0–200)
Basophils Relative: 0.7 %
Eosinophils Absolute: 235 cells/uL (ref 15–500)
Eosinophils Relative: 2.5 %
HCT: 46.6 % (ref 38.5–50.0)
Hemoglobin: 16.4 g/dL (ref 13.2–17.1)
Lymphs Abs: 3328 cells/uL (ref 850–3900)
MCH: 33.4 pg — ABNORMAL HIGH (ref 27.0–33.0)
MCHC: 35.2 g/dL (ref 32.0–36.0)
MCV: 94.9 fL (ref 80.0–100.0)
MPV: 10.1 fL (ref 7.5–12.5)
Monocytes Relative: 9.9 %
Neutro Abs: 4841 cells/uL (ref 1500–7800)
Neutrophils Relative %: 51.5 %
Platelets: 219 10*3/uL (ref 140–400)
RBC: 4.91 10*6/uL (ref 4.20–5.80)
RDW: 12.4 % (ref 11.0–15.0)
Total Lymphocyte: 35.4 %
WBC: 9.4 10*3/uL (ref 3.8–10.8)

## 2020-10-18 LAB — COMPREHENSIVE METABOLIC PANEL
AG Ratio: 1.6 (calc) (ref 1.0–2.5)
ALT: 31 U/L (ref 9–46)
AST: 22 U/L (ref 10–35)
Albumin: 4.3 g/dL (ref 3.6–5.1)
Alkaline phosphatase (APISO): 68 U/L (ref 35–144)
BUN/Creatinine Ratio: 19 (calc) (ref 6–22)
BUN: 24 mg/dL (ref 7–25)
CO2: 26 mmol/L (ref 20–32)
Calcium: 9.6 mg/dL (ref 8.6–10.3)
Chloride: 105 mmol/L (ref 98–110)
Creat: 1.27 mg/dL — ABNORMAL HIGH (ref 0.70–1.25)
Globulin: 2.7 g/dL (calc) (ref 1.9–3.7)
Glucose, Bld: 107 mg/dL — ABNORMAL HIGH (ref 65–99)
Potassium: 4.2 mmol/L (ref 3.5–5.3)
Sodium: 137 mmol/L (ref 135–146)
Total Bilirubin: 0.5 mg/dL (ref 0.2–1.2)
Total Protein: 7 g/dL (ref 6.1–8.1)

## 2020-10-18 LAB — PSA: PSA: 0.38 ng/mL (ref <=4.0)

## 2020-10-18 LAB — TSH: TSH: 1.36 mIU/L (ref 0.40–4.50)

## 2020-10-20 ENCOUNTER — Telehealth: Payer: Self-pay | Admitting: Family Medicine

## 2020-10-20 NOTE — Telephone Encounter (Signed)
Called and spoke to Gabriel Camacho. Gabriel Camacho states that he went and used the Debrox eardrops recommended by Dr. Olivia Mackie. He states that his right ear is clear but his left ear feels more stopped up and is still clogged. He will try again in 4 days per the instructions on the box. He asks if there is anything else to be done and will bring this up during his appointment on 11/10/2020.

## 2020-10-20 NOTE — Telephone Encounter (Signed)
Pt saw Dr.Tracy on 10/17/20 and had a question about some ear drops

## 2020-10-21 NOTE — Telephone Encounter (Signed)
Pt can use ear flush with warm water and hydrogen peroxide left ear

## 2020-10-22 NOTE — Telephone Encounter (Signed)
Called and spoke to [place]. Gabriel Camacho verbalized understanding and states that he has stopped taking the Debrox and felt that it was making his ears worse. He states that he will ask to have Dr. Ellen Henri nurse clean his ears during his appointment. I informed him of Dr. Audrie Gallus recommendation of Hydrogen peroxide and warm water. Gabriel Camacho verbalized understanding and had no further questions.

## 2020-11-10 ENCOUNTER — Ambulatory Visit (INDEPENDENT_AMBULATORY_CARE_PROVIDER_SITE_OTHER): Payer: BC Managed Care – PPO | Admitting: Family Medicine

## 2020-11-10 ENCOUNTER — Encounter: Payer: Self-pay | Admitting: Family Medicine

## 2020-11-10 ENCOUNTER — Other Ambulatory Visit: Payer: Self-pay

## 2020-11-10 VITALS — BP 120/80 | HR 75 | Temp 97.8°F | Ht 70.0 in | Wt 213.4 lb

## 2020-11-10 DIAGNOSIS — Z0001 Encounter for general adult medical examination with abnormal findings: Secondary | ICD-10-CM | POA: Diagnosis not present

## 2020-11-10 DIAGNOSIS — H6123 Impacted cerumen, bilateral: Secondary | ICD-10-CM | POA: Diagnosis not present

## 2020-11-10 DIAGNOSIS — H6120 Impacted cerumen, unspecified ear: Secondary | ICD-10-CM | POA: Insufficient documentation

## 2020-11-10 DIAGNOSIS — Z23 Encounter for immunization: Secondary | ICD-10-CM

## 2020-11-10 DIAGNOSIS — R7303 Prediabetes: Secondary | ICD-10-CM

## 2020-11-10 LAB — POCT GLYCOSYLATED HEMOGLOBIN (HGB A1C): Hemoglobin A1C: 5.4 % (ref 4.0–5.6)

## 2020-11-10 NOTE — Patient Instructions (Signed)
Nice to see you. Please try to take it easy with the weights to see if you can get your chest muscles to heal. Please continue with healthy diet.

## 2020-11-10 NOTE — Assessment & Plan Note (Signed)
This is a recurrent issue for the patient.  It was irrigated by CMA.

## 2020-11-10 NOTE — Assessment & Plan Note (Signed)
Physical exam completed.  Encouraged healthy diet.  Discussed getting back to exercising once his chest muscles improve.  Colon cancer screening and prostate cancer screening up-to-date.  He will be given a Prevnar and Shingrix vaccine today.  He declines HIV screening.  Prior lab work from earlier in January was reviewed.  We will check an A1c today given his elevated glucose.

## 2020-11-10 NOTE — Progress Notes (Signed)
Gabriel Rumps, MD Phone: [phone]  Gabriel Camacho is a 65 y.o. male who presents today for CPE.  Diet: Generally healthy.  Does eat some lunch meat and pickles.  Eats lots of fruit.  Does drink sugar-free Gatorade though no soda or sweet tea. Exercise: Walks though also does do body weight exercises. Colonoscopy: Cologuard - 01/09/2020. Prostate cancer screening: Up-to-date Family history-  Prostate cancer: No  Colon cancer: No Vaccines-   Flu: Up-to-date  Tetanus: Up-to-date  Shingles: Due  COVID19: Up-to-date  Pneumonia: Due HIV screening: Declines, low risk Hep C Screening: Up-to-date Tobacco use: Former smoker Alcohol use: No Illicit Drug use: No Dentist: Yes Ophthalmology: Yes  Musculoskeletal chest pain: Patient was seen earlier this month for discomfort in his left chest that started after lifting something.  He had a negative troponin and his EKG was reassuring.  He notes it has improved though he does still feel some discomfort at times.  He limited body weight exercises though he has been doing some curls and he thinks that may be continuing to irritate it.   Active Ambulatory Problems    Diagnosis Date Noted  . Encounter for general adult medical examination with abnormal findings 03/10/2016  . Rotator cuff syndrome 03/10/2016  . Overweight (BMI 25.0-29.9) 03/10/2016  . HTN (hypertension) 04/12/2016  . Intercostal muscle strain 12/24/2016  . HLD (hyperlipidemia) 11/28/2017  . Skin irritation 11/28/2017  . Prediabetes 02/27/2018  . Chest pain 10/17/2020  . Cerumen impaction 11/10/2020   Resolved Ambulatory Problems    Diagnosis Date Noted  . No Resolved Ambulatory Problems   Past Medical History:  Diagnosis Date  . Allergy   . Chicken pox     No family history on file.  Social History   Socioeconomic History  . Marital status: Married    Spouse name: Not on file  . Number of children: Not on file  . Years of education: Not on file  .  Highest education level: Not on file  Occupational History  . Not on file  Tobacco Use  . Smoking status: Former Smoker    Packs/day: 0.25    Types: Cigarettes  . Smokeless tobacco: Never Used  . Tobacco comment: smokes 2 packs a week. 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 Alcohol use: No    Alcohol/week: 0.0 standard drinks  . Drug use: No  . Sexual activity: Not on file  Other Topics Concern  . Not on file  Social History Narrative  . Not on file   Social Determinants of Health   Financial Resource Strain: Not on file  Food Insecurity: Not on file  Transportation Needs: Not on file  Physical Activity: Not on file  Stress: Not on file  Social Connections: Not on file  Intimate Partner Violence: Not on file    ROS  General:  Negative for nexplained weight loss, fever Skin: Negative for new or changing mole, sore that won't heal HEENT: Negative for trouble hearing, trouble seeing, ringing in ears, mouth sores, hoarseness, change in voice, dysphagia. CV:  Negative for chest pain, dyspnea, edema, palpitations Resp: Negative for cough, dyspnea, hemoptysis GI: Negative for nausea, vomiting, diarrhea, constipation, abdominal pain, melena, hematochezia. GU: Negative for dysuria, incontinence, urinary hesitance, hematuria, vaginal or penile discharge, polyuria, sexual difficulty, lumps in testicle or breasts MSK: Negative for muscle cramps or aches, joint pain or swelling Neuro: Negative for headaches, weakness, numbness, dizziness, passing out/fainting Psych: Negative for depression, anxiety, memory  problems  Objective  Physical Exam Vitals:   11/10/20 0940  BP: 120/80  Pulse: 75  Temp: 97.8 F (36.6 C)  SpO2: 99%    BP Readings from Last 3 Encounters:  11/10/20 120/80  10/17/20 126/70  09/04/18 138/78   Wt Readings from Last 3 Encounters:  11/10/20 213 lb 6.4 oz (96.8 kg)  10/17/20 214 lb (97.1 kg)  11/12/19 207 lb (93.9 kg)     Physical Exam Constitutional:      General: He is not in acute distress.    Appearance: He is not diaphoretic.  HENT:     Head: Normocephalic and atraumatic.     Ears:     Comments: TMs obscured by cerumen Eyes:     Conjunctiva/sclera: Conjunctivae normal.     Pupils: Pupils are equal, round, and reactive to light.  Cardiovascular:     Rate and Rhythm: Normal rate and regular rhythm.     Heart sounds: Normal heart sounds.  Pulmonary:     Effort: Pulmonary effort is normal.     Breath sounds: Normal breath sounds.  Abdominal:     General: Bowel sounds are normal. There is no distension.     Palpations: Abdomen is soft.     Tenderness: There is no abdominal tenderness. There is no guarding or rebound.  Musculoskeletal:        General: No edema.     Right lower leg: No edema.     Left lower leg: No edema.  Lymphadenopathy:     Cervical: No cervical adenopathy.  Skin:    General: Skin is warm and dry.  Neurological:     Mental Status: He is alert.  Psychiatric:        Mood and Affect: Mood normal.    Cerumen irrigation completed by CMA.  The patient tolerated this well with improvement in hearing.  Right TM normal, left TM still slightly obscured by a small amount of cerumen.  Assessment/Plan:   Problem List Items Addressed This Visit    Cerumen impaction    This is a recurrent issue for the patient.  It was irrigated by CMA.      Encounter for general adult medical examination with abnormal findings - Primary    Physical exam completed.  Encouraged healthy diet.  Discussed getting back to exercising once his chest muscles improve.  Colon cancer screening and prostate cancer screening up-to-date.  He will be given a Prevnar and Shingrix vaccine today.  He declines HIV screening.  Prior lab work from earlier in January was reviewed.  We will check an A1c today given his elevated glucose.      Prediabetes   Relevant Orders   POCT HgB A1C (Completed)    Other Visit  Diagnoses    Need for pneumococcal vaccination       Relevant Orders   Pneumococcal conjugate vaccine 13-valent (Completed)   Need for shingles vaccine       Relevant Orders   Varicella-zoster vaccine IM (Shingrix) (Completed)      This visit occurred during the SARS-CoV-2 public health emergency.  Safety protocols were in place, including screening questions prior to the visit, additional usage of staff PPE, and extensive cleaning of exam room while observing appropriate contact time as indicated for disinfecting solutions.    Gabriel Rumps, MD [place]

## 2020-11-23 ENCOUNTER — Other Ambulatory Visit: Payer: Self-pay | Admitting: Family Medicine

## 2021-01-09 ENCOUNTER — Other Ambulatory Visit: Payer: Self-pay | Admitting: Family Medicine

## 2021-02-05 ENCOUNTER — Ambulatory Visit (INDEPENDENT_AMBULATORY_CARE_PROVIDER_SITE_OTHER): Payer: BC Managed Care – PPO

## 2021-02-05 ENCOUNTER — Other Ambulatory Visit: Payer: Self-pay

## 2021-02-05 DIAGNOSIS — Z23 Encounter for immunization: Secondary | ICD-10-CM | POA: Diagnosis not present

## 2021-02-05 NOTE — Progress Notes (Signed)
Patient presented for shingrix injection to left deltoid, patient voiced no concerns nor showed any signs of distress during injection. 

## 2021-02-19 ENCOUNTER — Other Ambulatory Visit: Payer: Self-pay | Admitting: Family Medicine

## 2021-02-21 ENCOUNTER — Other Ambulatory Visit: Payer: Self-pay | Admitting: Family Medicine

## 2021-05-04 DIAGNOSIS — Z6830 Body mass index (BMI) 30.0-30.9, adult: Secondary | ICD-10-CM | POA: Diagnosis not present

## 2021-05-04 DIAGNOSIS — Z87891 Personal history of nicotine dependence: Secondary | ICD-10-CM | POA: Diagnosis not present

## 2021-05-04 DIAGNOSIS — E669 Obesity, unspecified: Secondary | ICD-10-CM | POA: Diagnosis not present

## 2021-05-04 DIAGNOSIS — Z8249 Family history of ischemic heart disease and other diseases of the circulatory system: Secondary | ICD-10-CM | POA: Diagnosis not present

## 2021-05-04 DIAGNOSIS — H5360 Unspecified night blindness: Secondary | ICD-10-CM | POA: Diagnosis not present

## 2021-05-04 DIAGNOSIS — E785 Hyperlipidemia, unspecified: Secondary | ICD-10-CM | POA: Diagnosis not present

## 2021-05-04 DIAGNOSIS — I10 Essential (primary) hypertension: Secondary | ICD-10-CM | POA: Diagnosis not present

## 2021-05-11 ENCOUNTER — Ambulatory Visit (INDEPENDENT_AMBULATORY_CARE_PROVIDER_SITE_OTHER): Payer: Medicare HMO | Admitting: Family Medicine

## 2021-05-11 ENCOUNTER — Other Ambulatory Visit: Payer: Self-pay

## 2021-05-11 VITALS — BP 118/78 | HR 71 | Temp 97.7°F | Ht 70.0 in | Wt 204.2 lb

## 2021-05-11 DIAGNOSIS — Z125 Encounter for screening for malignant neoplasm of prostate: Secondary | ICD-10-CM

## 2021-05-11 DIAGNOSIS — E785 Hyperlipidemia, unspecified: Secondary | ICD-10-CM

## 2021-05-11 DIAGNOSIS — R42 Dizziness and giddiness: Secondary | ICD-10-CM | POA: Diagnosis not present

## 2021-05-11 DIAGNOSIS — Z1329 Encounter for screening for other suspected endocrine disorder: Secondary | ICD-10-CM | POA: Diagnosis not present

## 2021-05-11 DIAGNOSIS — Z130 Encounter for screening for diseases of the blood and blood-forming organs and certain disorders involving the immune mechanism: Secondary | ICD-10-CM | POA: Diagnosis not present

## 2021-05-11 DIAGNOSIS — Z87898 Personal history of other specified conditions: Secondary | ICD-10-CM | POA: Diagnosis not present

## 2021-05-11 DIAGNOSIS — I10 Essential (primary) hypertension: Secondary | ICD-10-CM | POA: Diagnosis not present

## 2021-05-11 LAB — BASIC METABOLIC PANEL
BUN: 27 mg/dL — ABNORMAL HIGH (ref 6–23)
CO2: 25 mEq/L (ref 19–32)
Calcium: 9.7 mg/dL (ref 8.4–10.5)
Chloride: 104 mEq/L (ref 96–112)
Creatinine, Ser: 1.26 mg/dL (ref 0.40–1.50)
GFR: 59.85 mL/min — ABNORMAL LOW (ref >60.00)
Glucose, Bld: 105 mg/dL — ABNORMAL HIGH (ref 70–99)
Potassium: 4.1 mEq/L (ref 3.5–5.1)
Sodium: 137 mEq/L (ref 135–145)

## 2021-05-11 NOTE — Assessment & Plan Note (Signed)
Well-controlled on last check.  He will continue Lipitor 40 mg once daily.

## 2021-05-11 NOTE — Progress Notes (Signed)
Gabriel Rumps, MD Phone: [phone]  Gabriel Camacho is a 65 y.o. male who presents today for f/u.  HYPERTENSION Disease Monitoring Home BP Monitoring 108-120s/80 Chest pain- no    Dyspnea- no Medications Compliance-  taking HCTZ, lisinopril. Lightheadedness-  yes, see below  Edema- no  HYPERLIPIDEMIA Symptoms Chest pain on exertion:  no   Medications: Compliance- taking lipitor Right upper quadrant pain- no  Muscle aches- no  Prediabetes: Patient notes his diet.Marland Kitchen  He eats a generally healthy diet though when he eats something unhealthy he will balance it out the next day.  He walks and does body weight exercises.  Lightheadedness: Patient notes this is an intermittent issue.  It does not occur weekly.  It lasts about 3 seconds and occurs if he bends over and then stands up.  He has had no syncope.  He does drink a lot of Gatorade.  Maybe not enough water.    Social History   Tobacco Use  Smoking Status Former   Packs/day: 0.25   Types: Cigarettes  Smokeless Tobacco Never  Tobacco Comments   smokes 2 packs a week. 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 Swallow whole.     atorvastatin (LIPITOR) 40 MG tablet TAKE 1 TABLET BY MOUTH EVERY DAY 90 tablet 3   hydrochlorothiazide (HYDRODIURIL) 25 MG tablet TAKE 1 TABLET BY MOUTH EVERY DAY 90 tablet 0   lisinopril (ZESTRIL) 20 MG tablet TAKE 1 TABLET BY MOUTH EVERY DAY 90 tablet 1   No current facility-administered medications on file prior to visit.     ROS see history of present illness  Objective  Physical Exam Vitals:   05/11/21 0904  BP: 118/78  Pulse: 71  Temp: 97.7 F (36.5 C)  SpO2: 98%   Laying blood pressure 132/81 pulse 64 Sitting blood pressure 136/83 pulse 71 Standing blood pressure 136/87 pulse 71  BP Readings from Last 3 Encounters:  05/11/21 118/78  11/10/20  120/80  10/17/20 126/70   Wt Readings from Last 3 Encounters:  05/11/21 204 lb 3.2 oz (92.6 kg)  11/10/20 213 lb 6.4 oz (96.8 kg)  10/17/20 214 lb (97.1 kg)    Physical Exam Constitutional:      General: He is not in acute distress.    Appearance: He is not diaphoretic.  Cardiovascular:     Rate and Rhythm: Normal rate and regular rhythm.     Heart sounds: Normal heart sounds.  Pulmonary:     Effort: Pulmonary effort is normal.     Breath sounds: Normal breath sounds.  Musculoskeletal:     Right lower leg: No edema.     Left lower leg: No edema.  Skin:    General: Skin is warm and dry.  Neurological:     Mental Status: He is alert.     Assessment/Plan: Please see individual problem list.  Problem List Items Addressed This Visit     History of prediabetes    Encouraged to continue generally healthy diet and exercise.  Last A1c was in the normal range.  We will plan for yearly A1c's given his history of prediabetes.       Relevant Orders   HgB A1c   HLD (hyperlipidemia)    Well-controlled on last check.  He will continue Lipitor 40 mg once daily.  Relevant Orders   Comp Met (CMET)   Lipid panel   HTN (hypertension)    Controlled at home.  He has had some lightheadedness which I think may be related to inadequate water intake.  Discussed increasing his water intake and rising slowly.  He will continue his current blood pressure medicines though if he has more consistent issues with the lightheadedness he will let us know.  He will continue on lisinopril 20 mg once daily and HCTZ 25 mg daily.       Relevant Orders   Basic Metabolic Panel (BMET)   Orthostatic lightheadedness    Seems to be orthostatic in nature.  Discussed adequate hydration with water.  Given that this occurs intermittently we will have him continue to monitor.  No changes to his blood pressure medicines at this time.       Other Visit Diagnoses     Prostate cancer screening    -   Primary   Relevant Orders   PSA   Thyroid disorder screen       Relevant Orders   TSH   Screening for deficiency anemia       Relevant Orders   CBC      Return in about 6 months (around 11/11/2021) for CPE, labs 2 days prior.  This visit occurred during the SARS-CoV-2 public health emergency.  Safety protocols were in place, including screening questions prior to the visit, additional usage of staff PPE, and extensive cleaning of exam room while observing appropriate contact time as indicated for disinfecting solutions.    Gabriel Rumps, MD [place]

## 2021-05-11 NOTE — Assessment & Plan Note (Signed)
Controlled at home.  He has had some lightheadedness which I think may be related to inadequate water intake.  Discussed increasing his water intake and rising slowly.  He will continue his current blood pressure medicines though if he has more consistent issues with the lightheadedness he will let us know.  He will continue on lisinopril 20 mg once daily and HCTZ 25 mg daily.

## 2021-05-11 NOTE — Assessment & Plan Note (Signed)
Seems to be orthostatic in nature.  Discussed adequate hydration with water.  Given that this occurs intermittently we will have him continue to monitor.  No changes to his blood pressure medicines at this time.

## 2021-05-11 NOTE — Patient Instructions (Signed)
Nice to see you. Please try to increase your water intake and rise slowly if you are bent over.  If you start to have the lightheadedness more frequently please let me know and we can always back off on one of your blood pressure medicines. We will get lab work today. I will see you back in 6 months for your physical.  You will have lab work 2 days at a time.

## 2021-05-11 NOTE — Assessment & Plan Note (Signed)
Encouraged to continue generally healthy diet and exercise.  Last A1c was in the normal range.  We will plan for yearly A1c's given his history of prediabetes.

## 2021-05-23 ENCOUNTER — Other Ambulatory Visit: Payer: Self-pay | Admitting: Family Medicine

## 2021-06-30 DIAGNOSIS — Z85828 Personal history of other malignant neoplasm of skin: Secondary | ICD-10-CM | POA: Diagnosis not present

## 2021-06-30 DIAGNOSIS — L570 Actinic keratosis: Secondary | ICD-10-CM | POA: Diagnosis not present

## 2021-06-30 DIAGNOSIS — D2361 Other benign neoplasm of skin of right upper limb, including shoulder: Secondary | ICD-10-CM | POA: Diagnosis not present

## 2021-06-30 DIAGNOSIS — L821 Other seborrheic keratosis: Secondary | ICD-10-CM | POA: Diagnosis not present

## 2021-06-30 DIAGNOSIS — Z08 Encounter for follow-up examination after completed treatment for malignant neoplasm: Secondary | ICD-10-CM | POA: Diagnosis not present

## 2021-06-30 DIAGNOSIS — X32XXXA Exposure to sunlight, initial encounter: Secondary | ICD-10-CM | POA: Diagnosis not present

## 2021-07-16 DIAGNOSIS — M9903 Segmental and somatic dysfunction of lumbar region: Secondary | ICD-10-CM | POA: Diagnosis not present

## 2021-07-16 DIAGNOSIS — M5136 Other intervertebral disc degeneration, lumbar region: Secondary | ICD-10-CM | POA: Diagnosis not present

## 2021-07-16 DIAGNOSIS — M62830 Muscle spasm of back: Secondary | ICD-10-CM | POA: Diagnosis not present

## 2021-07-20 DIAGNOSIS — M62830 Muscle spasm of back: Secondary | ICD-10-CM | POA: Diagnosis not present

## 2021-07-20 DIAGNOSIS — M9903 Segmental and somatic dysfunction of lumbar region: Secondary | ICD-10-CM | POA: Diagnosis not present

## 2021-07-20 DIAGNOSIS — M5136 Other intervertebral disc degeneration, lumbar region: Secondary | ICD-10-CM | POA: Diagnosis not present

## 2021-07-22 DIAGNOSIS — M62830 Muscle spasm of back: Secondary | ICD-10-CM | POA: Diagnosis not present

## 2021-07-22 DIAGNOSIS — M9903 Segmental and somatic dysfunction of lumbar region: Secondary | ICD-10-CM | POA: Diagnosis not present

## 2021-07-22 DIAGNOSIS — M5136 Other intervertebral disc degeneration, lumbar region: Secondary | ICD-10-CM | POA: Diagnosis not present

## 2021-07-24 DIAGNOSIS — M9903 Segmental and somatic dysfunction of lumbar region: Secondary | ICD-10-CM | POA: Diagnosis not present

## 2021-07-24 DIAGNOSIS — M62830 Muscle spasm of back: Secondary | ICD-10-CM | POA: Diagnosis not present

## 2021-07-24 DIAGNOSIS — M5136 Other intervertebral disc degeneration, lumbar region: Secondary | ICD-10-CM | POA: Diagnosis not present

## 2021-07-27 DIAGNOSIS — M9903 Segmental and somatic dysfunction of lumbar region: Secondary | ICD-10-CM | POA: Diagnosis not present

## 2021-07-27 DIAGNOSIS — M62830 Muscle spasm of back: Secondary | ICD-10-CM | POA: Diagnosis not present

## 2021-07-27 DIAGNOSIS — M5136 Other intervertebral disc degeneration, lumbar region: Secondary | ICD-10-CM | POA: Diagnosis not present

## 2021-07-29 DIAGNOSIS — M9903 Segmental and somatic dysfunction of lumbar region: Secondary | ICD-10-CM | POA: Diagnosis not present

## 2021-07-29 DIAGNOSIS — M5136 Other intervertebral disc degeneration, lumbar region: Secondary | ICD-10-CM | POA: Diagnosis not present

## 2021-07-29 DIAGNOSIS — M62830 Muscle spasm of back: Secondary | ICD-10-CM | POA: Diagnosis not present

## 2021-07-30 DIAGNOSIS — M62830 Muscle spasm of back: Secondary | ICD-10-CM | POA: Diagnosis not present

## 2021-07-30 DIAGNOSIS — M5136 Other intervertebral disc degeneration, lumbar region: Secondary | ICD-10-CM | POA: Diagnosis not present

## 2021-07-30 DIAGNOSIS — M9903 Segmental and somatic dysfunction of lumbar region: Secondary | ICD-10-CM | POA: Diagnosis not present

## 2021-08-04 DIAGNOSIS — M5136 Other intervertebral disc degeneration, lumbar region: Secondary | ICD-10-CM | POA: Diagnosis not present

## 2021-08-04 DIAGNOSIS — M62830 Muscle spasm of back: Secondary | ICD-10-CM | POA: Diagnosis not present

## 2021-08-04 DIAGNOSIS — M9903 Segmental and somatic dysfunction of lumbar region: Secondary | ICD-10-CM | POA: Diagnosis not present

## 2021-08-06 DIAGNOSIS — M62830 Muscle spasm of back: Secondary | ICD-10-CM | POA: Diagnosis not present

## 2021-08-06 DIAGNOSIS — M9903 Segmental and somatic dysfunction of lumbar region: Secondary | ICD-10-CM | POA: Diagnosis not present

## 2021-08-06 DIAGNOSIS — M5136 Other intervertebral disc degeneration, lumbar region: Secondary | ICD-10-CM | POA: Diagnosis not present

## 2021-08-11 DIAGNOSIS — M5136 Other intervertebral disc degeneration, lumbar region: Secondary | ICD-10-CM | POA: Diagnosis not present

## 2021-08-11 DIAGNOSIS — M62830 Muscle spasm of back: Secondary | ICD-10-CM | POA: Diagnosis not present

## 2021-08-11 DIAGNOSIS — M9903 Segmental and somatic dysfunction of lumbar region: Secondary | ICD-10-CM | POA: Diagnosis not present

## 2021-08-13 DIAGNOSIS — M9903 Segmental and somatic dysfunction of lumbar region: Secondary | ICD-10-CM | POA: Diagnosis not present

## 2021-08-13 DIAGNOSIS — M62830 Muscle spasm of back: Secondary | ICD-10-CM | POA: Diagnosis not present

## 2021-08-13 DIAGNOSIS — M5136 Other intervertebral disc degeneration, lumbar region: Secondary | ICD-10-CM | POA: Diagnosis not present

## 2021-08-17 ENCOUNTER — Other Ambulatory Visit: Payer: Self-pay | Admitting: Family Medicine

## 2021-08-19 DIAGNOSIS — M62830 Muscle spasm of back: Secondary | ICD-10-CM | POA: Diagnosis not present

## 2021-08-19 DIAGNOSIS — M9903 Segmental and somatic dysfunction of lumbar region: Secondary | ICD-10-CM | POA: Diagnosis not present

## 2021-08-19 DIAGNOSIS — M5136 Other intervertebral disc degeneration, lumbar region: Secondary | ICD-10-CM | POA: Diagnosis not present

## 2021-08-24 DIAGNOSIS — R58 Hemorrhage, not elsewhere classified: Secondary | ICD-10-CM | POA: Diagnosis not present

## 2021-08-24 DIAGNOSIS — D1800 Hemangioma unspecified site: Secondary | ICD-10-CM | POA: Diagnosis not present

## 2021-08-24 DIAGNOSIS — D485 Neoplasm of uncertain behavior of skin: Secondary | ICD-10-CM | POA: Diagnosis not present

## 2021-08-26 DIAGNOSIS — M9903 Segmental and somatic dysfunction of lumbar region: Secondary | ICD-10-CM | POA: Diagnosis not present

## 2021-08-26 DIAGNOSIS — M5136 Other intervertebral disc degeneration, lumbar region: Secondary | ICD-10-CM | POA: Diagnosis not present

## 2021-08-26 DIAGNOSIS — M62830 Muscle spasm of back: Secondary | ICD-10-CM | POA: Diagnosis not present

## 2021-09-08 DIAGNOSIS — Z0100 Encounter for examination of eyes and vision without abnormal findings: Secondary | ICD-10-CM | POA: Diagnosis not present

## 2021-09-08 DIAGNOSIS — H524 Presbyopia: Secondary | ICD-10-CM | POA: Diagnosis not present

## 2021-09-09 DIAGNOSIS — M9903 Segmental and somatic dysfunction of lumbar region: Secondary | ICD-10-CM | POA: Diagnosis not present

## 2021-09-09 DIAGNOSIS — M5136 Other intervertebral disc degeneration, lumbar region: Secondary | ICD-10-CM | POA: Diagnosis not present

## 2021-09-09 DIAGNOSIS — M62830 Muscle spasm of back: Secondary | ICD-10-CM | POA: Diagnosis not present

## 2021-09-23 DIAGNOSIS — M5136 Other intervertebral disc degeneration, lumbar region: Secondary | ICD-10-CM | POA: Diagnosis not present

## 2021-09-23 DIAGNOSIS — M62830 Muscle spasm of back: Secondary | ICD-10-CM | POA: Diagnosis not present

## 2021-09-23 DIAGNOSIS — M9903 Segmental and somatic dysfunction of lumbar region: Secondary | ICD-10-CM | POA: Diagnosis not present

## 2021-10-14 DIAGNOSIS — M5136 Other intervertebral disc degeneration, lumbar region: Secondary | ICD-10-CM | POA: Diagnosis not present

## 2021-10-14 DIAGNOSIS — M62830 Muscle spasm of back: Secondary | ICD-10-CM | POA: Diagnosis not present

## 2021-10-14 DIAGNOSIS — M9903 Segmental and somatic dysfunction of lumbar region: Secondary | ICD-10-CM | POA: Diagnosis not present

## 2021-11-04 DIAGNOSIS — M62830 Muscle spasm of back: Secondary | ICD-10-CM | POA: Diagnosis not present

## 2021-11-04 DIAGNOSIS — M5136 Other intervertebral disc degeneration, lumbar region: Secondary | ICD-10-CM | POA: Diagnosis not present

## 2021-11-04 DIAGNOSIS — M9903 Segmental and somatic dysfunction of lumbar region: Secondary | ICD-10-CM | POA: Diagnosis not present

## 2021-11-09 ENCOUNTER — Other Ambulatory Visit (INDEPENDENT_AMBULATORY_CARE_PROVIDER_SITE_OTHER): Payer: Medicare HMO

## 2021-11-09 ENCOUNTER — Other Ambulatory Visit: Payer: Self-pay

## 2021-11-09 DIAGNOSIS — E785 Hyperlipidemia, unspecified: Secondary | ICD-10-CM

## 2021-11-09 DIAGNOSIS — Z1329 Encounter for screening for other suspected endocrine disorder: Secondary | ICD-10-CM | POA: Diagnosis not present

## 2021-11-09 DIAGNOSIS — Z130 Encounter for screening for diseases of the blood and blood-forming organs and certain disorders involving the immune mechanism: Secondary | ICD-10-CM

## 2021-11-09 DIAGNOSIS — Z125 Encounter for screening for malignant neoplasm of prostate: Secondary | ICD-10-CM

## 2021-11-09 DIAGNOSIS — Z87898 Personal history of other specified conditions: Secondary | ICD-10-CM

## 2021-11-09 DIAGNOSIS — R7303 Prediabetes: Secondary | ICD-10-CM | POA: Diagnosis not present

## 2021-11-09 LAB — COMPREHENSIVE METABOLIC PANEL
ALT: 29 U/L (ref 0–53)
AST: 19 U/L (ref 0–37)
Albumin: 4.2 g/dL (ref 3.5–5.2)
Alkaline Phosphatase: 62 U/L (ref 39–117)
BUN: 25 mg/dL — ABNORMAL HIGH (ref 6–23)
CO2: 27 mEq/L (ref 19–32)
Calcium: 8.9 mg/dL (ref 8.4–10.5)
Chloride: 104 mEq/L (ref 96–112)
Creatinine, Ser: 1.29 mg/dL (ref 0.40–1.50)
GFR: 57.98 mL/min — ABNORMAL LOW (ref >60.00)
Glucose, Bld: 113 mg/dL — ABNORMAL HIGH (ref 70–99)
Potassium: 4.5 mEq/L (ref 3.5–5.1)
Sodium: 141 mEq/L (ref 135–145)
Total Bilirubin: 0.7 mg/dL (ref 0.2–1.2)
Total Protein: 6.8 g/dL (ref 6.0–8.3)

## 2021-11-09 LAB — LIPID PANEL
Cholesterol: 144 mg/dL (ref 0–200)
HDL: 30.5 mg/dL — ABNORMAL LOW (ref >39.00)
LDL Cholesterol: 85 mg/dL (ref 0–99)
NonHDL: 113.27
Total CHOL/HDL Ratio: 5
Triglycerides: 139 mg/dL (ref 0.0–149.0)
VLDL: 27.8 mg/dL (ref 0.0–40.0)

## 2021-11-09 LAB — CBC
HCT: 43.5 % (ref 39.0–52.0)
Hemoglobin: 14.9 g/dL (ref 13.0–17.0)
MCHC: 34.2 g/dL (ref 30.0–36.0)
MCV: 95.1 fl (ref 78.0–100.0)
Platelets: 172 10*3/uL (ref 150.0–400.0)
RBC: 4.58 Mil/uL (ref 4.22–5.81)
RDW: 13.5 % (ref 11.5–15.5)
WBC: 6.6 10*3/uL (ref 4.0–10.5)

## 2021-11-09 LAB — HEMOGLOBIN A1C: Hgb A1c MFr Bld: 5.7 % (ref 4.6–6.5)

## 2021-11-09 LAB — PSA: PSA: 0.93 ng/mL (ref 0.10–4.00)

## 2021-11-09 LAB — TSH: TSH: 1.47 u[IU]/mL (ref 0.35–5.50)

## 2021-11-11 ENCOUNTER — Encounter: Payer: Self-pay | Admitting: Family Medicine

## 2021-11-11 ENCOUNTER — Ambulatory Visit (INDEPENDENT_AMBULATORY_CARE_PROVIDER_SITE_OTHER): Payer: Medicare HMO | Admitting: Family Medicine

## 2021-11-11 ENCOUNTER — Other Ambulatory Visit: Payer: Self-pay

## 2021-11-11 VITALS — BP 120/80 | HR 78 | Temp 98.3°F | Ht 70.0 in | Wt 211.4 lb

## 2021-11-11 DIAGNOSIS — E669 Obesity, unspecified: Secondary | ICD-10-CM

## 2021-11-11 DIAGNOSIS — I10 Essential (primary) hypertension: Secondary | ICD-10-CM

## 2021-11-11 DIAGNOSIS — Z23 Encounter for immunization: Secondary | ICD-10-CM

## 2021-11-11 DIAGNOSIS — Z0000 Encounter for general adult medical examination without abnormal findings: Secondary | ICD-10-CM | POA: Diagnosis not present

## 2021-11-11 NOTE — Assessment & Plan Note (Signed)
Physical exam completed.  I encouraged continued healthy diet and exercise.  Discussed his A1c of 5.7 is in the prediabetic range.  Encourage diet and exercise to help with that.  Discussed colon cancer screening would occur again next year.  We will discuss colonoscopy versus Cologuard at his next physical.  He will be given his Pneumovax today to bring his vaccines up-to-date.  Lab work was reviewed today.  Discussed PSA trended up slightly though not significantly and was still in the normal range.  We will continue to monitor this yearly.

## 2021-11-11 NOTE — Progress Notes (Signed)
Tommi Rumps, MD Phone: [phone]  Gabriel Camacho is a 66 y.o. male who presents today for CPE.  Diet: generally healthy, limits sweets, no soda or sweet tea, plenty of fruits and vegetables Exercise: walks frequently, strength exercises 3x/week Colonoscopy: cologuard 01/09/20 Prostate cancer screening: UTD Family history-  Prostate cancer: no  Colon cancer: no Vaccines-   Flu: UTD  Tetanus: UTD  Shingles: UTD  COVID19: UTD  Pneumonia: due for pneumovax HIV screening: declined previously Hep C Screening: UTD Tobacco use: no Alcohol use: no Illicit Drug use: no Dentist: yes Ophthalmology: yes The patient gets up to urinate 2x/night. Notes he drinks a lot of water. No dysuria, straining, post-void dribbling, or poor urine flow.    Active Ambulatory Problems    Diagnosis Date Noted   Routine general medical examination at a health care facility 03/10/2016   Rotator cuff syndrome 03/10/2016   Overweight (BMI 25.0-29.9) 03/10/2016   HTN (hypertension) 04/12/2016   Intercostal muscle strain 12/24/2016   HLD (hyperlipidemia) 11/28/2017   Skin irritation 11/28/2017   History of prediabetes 02/27/2018   Cerumen impaction 11/10/2020   Orthostatic lightheadedness 05/11/2021   Tear of right biceps muscle 09/25/2014   Resolved Ambulatory Problems    Diagnosis Date Noted   Chest pain 10/17/2020   Past Medical History:  Diagnosis Date   Allergy    Chicken pox     No family history on file.  Social History   Socioeconomic History   Marital status: Married    Spouse name: Not on file   Number of children: Not on file   Years of education: Not on file   Highest education level: Not on file  Occupational History   Not on file  Tobacco Use   Smoking status: Former    Packs/day: 0.25    Types: Cigarettes   Smokeless tobacco: Never   Tobacco comments:    smokes 2 packs a week. 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  Substance and Sexual Activity   Alcohol use: No     Alcohol/week: 0.0 standard drinks   Drug use: No   Sexual activity: Not on file  Other Topics Concern   Not on file  Social History Narrative   Not on file   Social Determinants of Health   Financial Resource Strain: Not on file  Food Insecurity: Not on file  Transportation Needs: Not on file  Physical Activity: Not on file  Stress: Not on file  Social Connections: Not on file  Intimate Partner Violence: Not on file    ROS  General:  Negative for nexplained weight loss, fever Skin: Negative for new or changing mole, sore that won't heal HEENT: Negative for trouble hearing, trouble seeing, ringing in ears, mouth sores, hoarseness, change in voice, dysphagia. CV:  Negative for chest pain, dyspnea, edema, palpitations Resp: Negative for cough, dyspnea, hemoptysis GI: Negative for nausea, vomiting, diarrhea, constipation, abdominal pain, melena, hematochezia. GU: Negative for dysuria, incontinence, urinary hesitance, hematuria, vaginal or penile discharge, polyuria, sexual difficulty, lumps in testicle or breasts MSK: Negative for muscle cramps or aches, joint pain or swelling Neuro: Negative for headaches, weakness, numbness, dizziness, passing out/fainting Psych: Negative for depression, anxiety, memory problems  Objective  Physical Exam Vitals:   11/11/21 0804  BP: 120/80  Pulse: 78  Temp: 98.3 F (36.8 C)  SpO2: 98%    BP Readings from Last 3 Encounters:  11/11/21 120/80  05/11/21 118/78  11/10/20 120/80   Wt Readings from Last 3 Encounters:  11/11/21 211 lb 6.4 oz (95.9 kg)  05/11/21 204 lb 3.2 oz (92.6 kg)  11/10/20 213 lb 6.4 oz (96.8 kg)    Physical Exam Constitutional:      General: He is not in acute distress.    Appearance: He is not diaphoretic.  HENT:     Head: Normocephalic and atraumatic.  Eyes:     Conjunctiva/sclera: Conjunctivae normal.     Pupils: Pupils are equal, round, and reactive to light.  Cardiovascular:     Rate and Rhythm:  Normal rate and regular rhythm.     Heart sounds: Normal heart sounds.  Pulmonary:     Effort: Pulmonary effort is normal.     Breath sounds: Normal breath sounds.  Abdominal:     General: Bowel sounds are normal. There is no distension.     Palpations: Abdomen is soft.     Tenderness: There is no abdominal tenderness. There is no guarding or rebound.  Musculoskeletal:     Right lower leg: No edema.     Left lower leg: No edema.  Lymphadenopathy:     Cervical: No cervical adenopathy.  Skin:    General: Skin is warm and dry.  Neurological:     Mental Status: He is alert.  Psychiatric:        Mood and Affect: Mood normal.     Assessment/Plan:   Problem List Items Addressed This Visit     HTN (hypertension)   Relevant Orders   Basic Metabolic Panel (BMET)   Routine general medical examination at a health care facility - Primary    Physical exam completed.  I encouraged continued healthy diet and exercise.  Discussed his A1c of 5.7 is in the prediabetic range.  Encourage diet and exercise to help with that.  Discussed colon cancer screening would occur again next year.  We will discuss colonoscopy versus Cologuard at his next physical.  He will be given his Pneumovax today to bring his vaccines up-to-date.  Lab work was reviewed today.  Discussed PSA trended up slightly though not significantly and was still in the normal range.  We will continue to monitor this yearly.      Other Visit Diagnoses     Need for pneumococcal vaccination       Relevant Orders   Pneumococcal polysaccharide vaccine 23-valent greater than or equal to 2yo subcutaneous/IM (Completed)       Return in about 6 months (around 05/11/2022) for HTN, labs 2 days prior.  This visit occurred during the SARS-CoV-2 public health emergency.  Safety protocols were in place, including screening questions prior to the visit, additional usage of staff PPE, and extensive cleaning of exam room while observing appropriate  contact time as indicated for disinfecting solutions.    Tommi Rumps, MD [place]

## 2021-11-11 NOTE — Patient Instructions (Signed)
Nice to see you. Please continue with your healthy diet and activity level. We will follow-up in 6 months and get lab work a couple of days ahead of time.

## 2021-12-03 DIAGNOSIS — M9903 Segmental and somatic dysfunction of lumbar region: Secondary | ICD-10-CM | POA: Diagnosis not present

## 2021-12-03 DIAGNOSIS — M5136 Other intervertebral disc degeneration, lumbar region: Secondary | ICD-10-CM | POA: Diagnosis not present

## 2021-12-03 DIAGNOSIS — M62830 Muscle spasm of back: Secondary | ICD-10-CM | POA: Diagnosis not present

## 2021-12-30 ENCOUNTER — Other Ambulatory Visit: Payer: Self-pay | Admitting: Family Medicine

## 2021-12-30 DIAGNOSIS — M5136 Other intervertebral disc degeneration, lumbar region: Secondary | ICD-10-CM | POA: Diagnosis not present

## 2021-12-30 DIAGNOSIS — M62830 Muscle spasm of back: Secondary | ICD-10-CM | POA: Diagnosis not present

## 2021-12-30 DIAGNOSIS — M9903 Segmental and somatic dysfunction of lumbar region: Secondary | ICD-10-CM | POA: Diagnosis not present

## 2022-01-27 DIAGNOSIS — M9903 Segmental and somatic dysfunction of lumbar region: Secondary | ICD-10-CM | POA: Diagnosis not present

## 2022-01-27 DIAGNOSIS — M5136 Other intervertebral disc degeneration, lumbar region: Secondary | ICD-10-CM | POA: Diagnosis not present

## 2022-01-27 DIAGNOSIS — M62830 Muscle spasm of back: Secondary | ICD-10-CM | POA: Diagnosis not present

## 2022-02-15 ENCOUNTER — Other Ambulatory Visit: Payer: Self-pay | Admitting: Family Medicine

## 2022-02-24 DIAGNOSIS — M62830 Muscle spasm of back: Secondary | ICD-10-CM | POA: Diagnosis not present

## 2022-02-24 DIAGNOSIS — M5136 Other intervertebral disc degeneration, lumbar region: Secondary | ICD-10-CM | POA: Diagnosis not present

## 2022-02-24 DIAGNOSIS — M9903 Segmental and somatic dysfunction of lumbar region: Secondary | ICD-10-CM | POA: Diagnosis not present

## 2022-03-11 ENCOUNTER — Telehealth: Payer: Self-pay | Admitting: Family Medicine

## 2022-03-11 NOTE — Telephone Encounter (Signed)
Spoke with spouse she stated to call his cell

## 2022-03-15 ENCOUNTER — Ambulatory Visit (INDEPENDENT_AMBULATORY_CARE_PROVIDER_SITE_OTHER): Payer: Medicare HMO

## 2022-03-15 VITALS — Ht 70.0 in | Wt 211.0 lb

## 2022-03-15 DIAGNOSIS — Z0000 Encounter for general adult medical examination without abnormal findings: Secondary | ICD-10-CM | POA: Diagnosis not present

## 2022-03-15 NOTE — Patient Instructions (Addendum)
  Gabriel Camacho , Thank you for taking time to come for your Medicare Wellness Visit. I appreciate your ongoing commitment to your health goals. Please review the following plan we discussed and let me know if I can assist you in the future.   These are the goals we discussed:  Goals      Maintain healthy lifetsyle     Stay active Healthy diet        This is a list of the screening recommended for you and due dates:  Health Maintenance  Topic Date Due   Flu Shot  05/11/2022   Cologuard (Stool DNA test)  01/09/2023   Tetanus Vaccine  11/29/2027   Pneumonia Vaccine  Completed   COVID-19 Vaccine  Completed   Hepatitis C Screening: USPSTF Recommendation to screen - Ages 54-79 yo.  Completed   Zoster (Shingles) Vaccine  Completed   HPV Vaccine  Aged Out   Colon Cancer Screening  Discontinued

## 2022-03-15 NOTE — Progress Notes (Signed)
Subjective:   Gabriel Camacho is a 66 y.o. male who presents for an Initial Medicare Annual Wellness Visit.  Review of Systems    No ROS.  Medicare Wellness Virtual Visit.  Visual/audio telehealth visit, UTA vital signs.   See social history for additional risk factors.   Cardiac Risk Factors include: advanced age (>47mn, >>39women)     Objective:    Today's Vitals   03/15/22 1240  Weight: 211 lb (95.7 kg)  Height: '5\' 10"'$  (1.778 m)   Body mass index is 30.28 kg/m.     03/15/2022   12:39 PM  Advanced Directives  Does Patient Have a Medical Advance Directive? Yes  Type of AParamedicof AStoverLiving will  Does patient want to make changes to medical advance directive? No - Patient declined  Copy of HPinonin Chart? No - copy requested    Current Medications (verified) Outpatient Encounter Medications as of 03/15/2022  Medication Sig   aspirin EC 81 MG tablet Take 81 mg by mouth daily. Swallow whole.   atorvastatin (LIPITOR) 40 MG tablet TAKE 1 TABLET BY MOUTH EVERY DAY   hydrochlorothiazide (HYDRODIURIL) 25 MG tablet TAKE 1 TABLET BY MOUTH EVERY DAY   lisinopril (ZESTRIL) 20 MG tablet TAKE 1 TABLET BY MOUTH EVERY DAY   No facility-administered encounter medications on file as of 03/15/2022.    Allergies (verified) Patient has no known allergies.   History: Past Medical History:  Diagnosis Date   Allergy    Chicken pox    Past Surgical History:  Procedure Laterality Date   WRIST SURGERY Left    History reviewed. No pertinent family history. Social History   Socioeconomic History   Marital status: Married    Spouse name: Not on file   Number of children: Not on file   Years of education: Not on file   Highest education level: Not on file  Occupational History   Not on file  Tobacco Use   Smoking status: Former    Packs/day: 0.25    Types: Cigarettes   Smokeless tobacco: Never   Tobacco comments:    smokes  2 packs a week. 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  Substance and Sexual Activity   Alcohol use: No    Alcohol/week: 0.0 standard drinks   Drug use: No   Sexual activity: Not on file  Other Topics Concern   Not on file  Social History Narrative   Not on file   Social Determinants of Health   Financial Resource Strain: Low Risk    Difficulty of Paying Living Expenses: Not hard at all  Food Insecurity: No Food Insecurity   Worried About RCharity fundraiserin the Last Year: Never true   [place]arden [place]in the Last Year: Never true  Transportation Needs: No Transportation Needs   Lack of Transportation (Medical): No   Lack of Transportation (Non-Medical): No  Physical Activity: Not on file  Stress: No Stress Concern Present   Feeling of Stress : Not at all  Social Connections: Unknown   Frequency of Communication with Friends and Family: Not on file   Frequency of Social Gatherings with Friends and Family: Not on file   Attends Religious Services: Not on file   Active Member of Clubs or Organizations: Not on file   Attends CArchivistMeetings: Not on file   Marital Status: Married    Tobacco Counseling Counseling given: Not Answered Tobacco  comments: smokes 2 packs a week. 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 0  Vision? 0  Difficulty concentrating or making decisions? 0  Walking or climbing stairs? 0  Dressing or bathing? 0  Doing errands, shopping? 0  Preparing Food and eating ? N  Using the Toilet? N  In the past six months, have you accidently leaked urine? N  Do you have problems with loss of bowel control? N  Managing your Medications? N  Managing your Finances? N  Housekeeping or  managing your Housekeeping? N    Patient Care Team: Leone Haven, MD as PCP - General (Family Medicine)  Indicate any recent Medical Services you may have received from other than [facility] providers in the past year (date may be approximate).     Assessment:   This is a routine wellness examination for [name].  Virtual Visit via Telephone Note  I connected with  [name] on 03/15/22 at 12:30 PM EDT by telephone and verified that I am speaking with the correct person using two identifiers.  Persons participating in the virtual visit: patient/Nurse Health Advisor   I discussed the limitations of performing an evaluation and management service by telehealth. We continued and completed visit with audio only. Some vital signs may be absent or patient reported.   Hearing/Vision screen Hearing Screening - Comments:: Patient is able to hear conversational tones without difficulty.  No issues reported. Vision Screening - Comments:: Followed by [facility] Wears corrective lenses They have seen their ophthalmologist in the last 12 months.    Dietary issues and exercise activities discussed: Current Exercise Habits: Home exercise routine, Type of exercise: walking;strength training/weights;stretching, Intensity: Mild   Goals Addressed             This Visit's Progress    Maintain healthy lifetsyle       Stay active Healthy diet       Depression Screen    03/15/2022   12:44 PM 11/11/2021    8:06 AM 05/11/2021    9:06 AM 11/12/2019    3:24 PM 09/04/2018    3:39 PM 11/28/2017    3:57 PM  PHQ 2/9 Scores  PHQ - 2 Score 0 0 0 0 0 0    Fall Risk    03/15/2022   12:45 PM 05/11/2021    9:05 AM 10/17/2020    2:37 PM 11/12/2019    3:24 PM  [place] in the past year? 0 0 0 0  Number falls in past yr: 0 0 0 0  Injury with Fall?   0   Follow up Falls evaluation completed Falls evaluation completed Falls evaluation completed Falls evaluation completed    [place]:  Home free of loose throw rugs in walkways, pet beds, electrical cords, etc? Yes  Adequate lighting in your home to reduce risk of falls? Yes   ASSISTIVE DEVICES UTILIZED TO PREVENT FALLS:  Life alert? No  Use of a cane, walker or w/c? No   TIMED UP AND GO: Was the test performed? No .    Cognitive Function:  Patient  is alert and oriented x3.       Immunizations Immunization History  Administered Date(s) Administered   Influenza, High Dose Seasonal PF 07/27/2021   Influenza,inj,Quad PF,6+ Mos 09/05/2018, 09/09/2019   Influenza-Unspecified 10/10/2017, 09/09/2019, 08/11/2020   PFIZER(Purple Top)SARS-COV-2 Vaccination 01/10/2020, 02/21/2020, 09/02/2020   Pfizer Covid-19 Vaccine Bivalent Booster 60yr & up 07/29/2021   Pneumococcal Conjugate-13 11/10/2020   Pneumococcal Polysaccharide-23 03/09/2016, 11/11/2021   Tdap 11/28/2017   Zoster Recombinat (Shingrix) 11/10/2020, 02/05/2021   Screening Tests Health Maintenance  Topic Date Due   INFLUENZA VACCINE  05/11/2022   Fecal DNA (Cologuard)  01/09/2023   TETANUS/TDAP  11/29/2027   Pneumonia Vaccine 66 Years old  Completed   COVID-19 Vaccine  Completed   Hepatitis C Screening  Completed   Zoster Vaccines- Shingrix  Completed   HPV VACCINES  Aged Out   COLONOSCOPY (Pts 45-451yrInsurance coverage will need to be confirmed)  Discontinued   Health Maintenance There are no preventive care reminders to display for this patient.  Lung Cancer Screening: (Low Dose CT Chest recommended if Age 66-80ears, 30 pack-year currently smoking OR have quit w/in 15years.) does not qualify.   Vision Screening: Recommended annual ophthalmology exams for early detection of glaucoma and other disorders of the eye.  Dental Screening: Recommended annual dental exams for proper oral hygiene  Community Resource Referral / Chronic Care Management: CRR required this visit?  No   CCM required this visit?  No       Plan:   Keep all routine maintenance appointments.   I have personally reviewed and noted the following in the patient's chart:   Medical and social history Use of alcohol, tobacco or illicit drugs  Current medications and supplements including opioid prescriptions. Patient is not currently taking opioid prescriptions. Functional ability and status Nutritional status Physical activity Advanced directives List of other physicians Hospitalizations, surgeries, and ER visits in previous 12 months Vitals Screenings to include cognitive, depression, and falls Referrals and appointments  In addition, I have reviewed and discussed with patient certain preventive protocols, quality metrics, and best practice recommendations. A written personalized care plan for preventive services as well as general preventive health recommendations were provided to patient.     OBVarney BilesLPN   6/04/15/2830

## 2022-03-24 DIAGNOSIS — M9903 Segmental and somatic dysfunction of lumbar region: Secondary | ICD-10-CM | POA: Diagnosis not present

## 2022-03-24 DIAGNOSIS — M62830 Muscle spasm of back: Secondary | ICD-10-CM | POA: Diagnosis not present

## 2022-03-24 DIAGNOSIS — M5136 Other intervertebral disc degeneration, lumbar region: Secondary | ICD-10-CM | POA: Diagnosis not present

## 2022-04-12 ENCOUNTER — Ambulatory Visit: Payer: Medicare HMO | Admitting: Family Medicine

## 2022-04-21 DIAGNOSIS — M62830 Muscle spasm of back: Secondary | ICD-10-CM | POA: Diagnosis not present

## 2022-04-21 DIAGNOSIS — M9903 Segmental and somatic dysfunction of lumbar region: Secondary | ICD-10-CM | POA: Diagnosis not present

## 2022-04-21 DIAGNOSIS — M5136 Other intervertebral disc degeneration, lumbar region: Secondary | ICD-10-CM | POA: Diagnosis not present

## 2022-05-10 ENCOUNTER — Other Ambulatory Visit (INDEPENDENT_AMBULATORY_CARE_PROVIDER_SITE_OTHER): Payer: Medicare HMO

## 2022-05-10 DIAGNOSIS — I10 Essential (primary) hypertension: Secondary | ICD-10-CM

## 2022-05-10 LAB — BASIC METABOLIC PANEL
BUN: 21 mg/dL (ref 6–23)
CO2: 25 mEq/L (ref 19–32)
Calcium: 8.9 mg/dL (ref 8.4–10.5)
Chloride: 106 mEq/L (ref 96–112)
Creatinine, Ser: 1.23 mg/dL (ref 0.40–1.50)
GFR: 61.17 mL/min (ref >60.00)
Glucose, Bld: 120 mg/dL — ABNORMAL HIGH (ref 70–99)
Potassium: 4.3 mEq/L (ref 3.5–5.1)
Sodium: 140 mEq/L (ref 135–145)

## 2022-05-11 ENCOUNTER — Encounter: Payer: Self-pay | Admitting: Family Medicine

## 2022-05-11 ENCOUNTER — Ambulatory Visit (INDEPENDENT_AMBULATORY_CARE_PROVIDER_SITE_OTHER): Payer: Medicare HMO | Admitting: Family Medicine

## 2022-05-11 VITALS — BP 130/78 | HR 74 | Temp 98.0°F | Ht 70.0 in | Wt 204.2 lb

## 2022-05-11 DIAGNOSIS — I10 Essential (primary) hypertension: Secondary | ICD-10-CM | POA: Diagnosis not present

## 2022-05-11 DIAGNOSIS — K4090 Unilateral inguinal hernia, without obstruction or gangrene, not specified as recurrent: Secondary | ICD-10-CM | POA: Diagnosis not present

## 2022-05-11 LAB — BASIC METABOLIC PANEL
BUN: 20 mg/dL (ref 6–23)
CO2: 24 mEq/L (ref 19–32)
Calcium: 9.1 mg/dL (ref 8.4–10.5)
Chloride: 105 mEq/L (ref 96–112)
Creatinine, Ser: 1.13 mg/dL (ref 0.40–1.50)
GFR: 67.72 mL/min (ref >60.00)
Glucose, Bld: 116 mg/dL — ABNORMAL HIGH (ref 70–99)
Potassium: 4.4 mEq/L (ref 3.5–5.1)
Sodium: 137 mEq/L (ref 135–145)

## 2022-05-11 MED ORDER — HYDROCHLOROTHIAZIDE 25 MG PO TABS: 12.5000 mg | ORAL_TABLET | Freq: Every day | ORAL | 3 refills | Status: DC

## 2022-05-11 MED ORDER — LISINOPRIL 20 MG PO TABS: 10.0000 mg | ORAL_TABLET | Freq: Every day | ORAL | 3 refills | Status: DC

## 2022-05-11 NOTE — Assessment & Plan Note (Signed)
Right-sided and chronic.  He is asymptomatic.  We will refer to general surgery to discuss his options.  Discussed if it was stuck out or he develops significant pain he needs to be evaluated immediately in person.

## 2022-05-11 NOTE — Patient Instructions (Signed)
Nice to see you. Someone from [facility] will call you to set up an evaluation for your hernia. Please cut the lisinopril and HCTZ in half and take half a tablet daily of each.  If you have trouble cutting them in half please let me know and I can send in new dosages. If the lightheadedness does not improve with this change please let us know.

## 2022-05-11 NOTE — Progress Notes (Signed)
fol

## 2022-05-11 NOTE — Assessment & Plan Note (Signed)
Adequately controlled.  His lightheadedness is likely related to overtreatment of his blood pressure.  He will reduce his lisinopril dose to 10 mg daily and HCTZ dose to 12.5 mg daily.  Discussed that my preference would be to have him on the medication on a daily basis as opposed to every other day.  He will let us know if the lightheadedness does not remain resolved with these dose changes.

## 2022-05-11 NOTE — Progress Notes (Signed)
Tommi Rumps, MD Phone: [phone]  Gabriel Camacho is a 66 y.o. male who presents today for f/u.  HYPERTENSION Disease Monitoring Home BP Monitoring 110/60 Chest pain- no    Dyspnea- no Medications Compliance-  taking lisinopril, HCTZ. Lightheadedness-  yes, notes this was occurring when he would stand up and would be brief, no syncope, he started taking his medications every other day a few days ago and the light headedness resolved  Edema- no BMET    Component Value Date/Time   NA 140 05/10/2022 0737   K 4.3 05/10/2022 0737   CL 106 05/10/2022 0737   CO2 25 05/10/2022 0737   GLUCOSE 120 (H) 05/10/2022 0737   BUN 21 05/10/2022 0737   CREATININE 1.23 05/10/2022 0737   CREATININE 1.27 (H) 10/17/2020 1523   CALCIUM 8.9 05/10/2022 0737   Right inguinal hernia: Patient notes this has been present a long time.  There is no pain.  He reports that it stays out.  He notes bowel movements daily.  He passes gas daily.  Social History   Tobacco Use  Smoking Status Former   Packs/day: 0.25   Types: Cigarettes  Smokeless Tobacco Never  Tobacco Comments   smokes 2 packs a week. 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 Swallow whole.     atorvastatin (LIPITOR) 40 MG tablet TAKE 1 TABLET BY MOUTH EVERY DAY 90 tablet 3   No current facility-administered medications on file prior to visit.     ROS see history of present illness  Objective  Physical Exam Vitals:   05/11/22 0802  BP: 130/78  Pulse: 74  Temp: 98 F (36.7 C)  SpO2: 98%    BP Readings from Last 3 Encounters:  05/11/22 130/78  11/11/21 120/80  05/11/21 118/78   Wt Readings from Last 3 Encounters:  05/11/22 204 lb 3.2 oz (92.6 kg)  03/15/22 211 lb (95.7 kg)  11/11/21 211 lb 6.4 oz (95.9 kg)    Physical Exam Constitutional:      General: He is not in acute distress.     Appearance: He is not diaphoretic.  Cardiovascular:     Rate and Rhythm: Normal rate and regular rhythm.     Heart sounds: Normal heart sounds.  Pulmonary:     Effort: Pulmonary effort is normal.     Breath sounds: Normal breath sounds.  Abdominal:     Hernia: A hernia (Right inguinal hernia noted that is fully reducible on laying down, nontender) is present.  Skin:    General: Skin is warm and dry.  Neurological:     Mental Status: He is alert.      Assessment/Plan: Please see individual problem list.  Problem List Items Addressed This Visit     HTN (hypertension) - Primary (Chronic)    Adequately controlled.  His lightheadedness is likely related to overtreatment of his blood pressure.  He will reduce his lisinopril dose to 10 mg daily and HCTZ dose to 12.5 mg daily.  Discussed that my preference would be to have him on the medication on a daily basis as opposed to every other day.  He will let us know if the lightheadedness does not remain resolved with these dose changes.      Relevant Medications   hydrochlorothiazide (HYDRODIURIL) 25 MG tablet   lisinopril (ZESTRIL) 20 MG tablet  Other Relevant Orders   Basic Metabolic Panel (BMET)   Inguinal hernia (Chronic)    Right-sided and chronic.  He is asymptomatic.  We will refer to general surgery to discuss his options.  Discussed if it was stuck out or he develops significant pain he needs to be evaluated immediately in person.      Relevant Orders   Ambulatory referral to General Surgery   Return in about 6 months (around 11/11/2022) for CPE.   Tommi Rumps, MD [place]

## 2022-05-13 DIAGNOSIS — Z008 Encounter for other general examination: Secondary | ICD-10-CM | POA: Diagnosis not present

## 2022-05-13 DIAGNOSIS — E785 Hyperlipidemia, unspecified: Secondary | ICD-10-CM | POA: Diagnosis not present

## 2022-05-13 DIAGNOSIS — Z720 Tobacco use: Secondary | ICD-10-CM | POA: Diagnosis not present

## 2022-05-13 DIAGNOSIS — Z7982 Long term (current) use of aspirin: Secondary | ICD-10-CM | POA: Diagnosis not present

## 2022-05-13 DIAGNOSIS — I10 Essential (primary) hypertension: Secondary | ICD-10-CM | POA: Diagnosis not present

## 2022-05-13 DIAGNOSIS — Z8249 Family history of ischemic heart disease and other diseases of the circulatory system: Secondary | ICD-10-CM | POA: Diagnosis not present

## 2022-05-19 DIAGNOSIS — M62830 Muscle spasm of back: Secondary | ICD-10-CM | POA: Diagnosis not present

## 2022-05-19 DIAGNOSIS — M5136 Other intervertebral disc degeneration, lumbar region: Secondary | ICD-10-CM | POA: Diagnosis not present

## 2022-05-19 DIAGNOSIS — M9903 Segmental and somatic dysfunction of lumbar region: Secondary | ICD-10-CM | POA: Diagnosis not present

## 2022-05-20 DIAGNOSIS — K4090 Unilateral inguinal hernia, without obstruction or gangrene, not specified as recurrent: Secondary | ICD-10-CM | POA: Diagnosis not present

## 2022-06-16 DIAGNOSIS — M62830 Muscle spasm of back: Secondary | ICD-10-CM | POA: Diagnosis not present

## 2022-06-16 DIAGNOSIS — M5136 Other intervertebral disc degeneration, lumbar region: Secondary | ICD-10-CM | POA: Diagnosis not present

## 2022-06-16 DIAGNOSIS — M9903 Segmental and somatic dysfunction of lumbar region: Secondary | ICD-10-CM | POA: Diagnosis not present

## 2022-07-14 DIAGNOSIS — D2272 Melanocytic nevi of left lower limb, including hip: Secondary | ICD-10-CM | POA: Diagnosis not present

## 2022-07-14 DIAGNOSIS — D2262 Melanocytic nevi of left upper limb, including shoulder: Secondary | ICD-10-CM | POA: Diagnosis not present

## 2022-07-14 DIAGNOSIS — D2261 Melanocytic nevi of right upper limb, including shoulder: Secondary | ICD-10-CM | POA: Diagnosis not present

## 2022-07-14 DIAGNOSIS — M5136 Other intervertebral disc degeneration, lumbar region: Secondary | ICD-10-CM | POA: Diagnosis not present

## 2022-07-14 DIAGNOSIS — Z85828 Personal history of other malignant neoplasm of skin: Secondary | ICD-10-CM | POA: Diagnosis not present

## 2022-07-14 DIAGNOSIS — M62830 Muscle spasm of back: Secondary | ICD-10-CM | POA: Diagnosis not present

## 2022-07-14 DIAGNOSIS — M9903 Segmental and somatic dysfunction of lumbar region: Secondary | ICD-10-CM | POA: Diagnosis not present

## 2022-07-14 DIAGNOSIS — L821 Other seborrheic keratosis: Secondary | ICD-10-CM | POA: Diagnosis not present

## 2022-08-11 DIAGNOSIS — M5136 Other intervertebral disc degeneration, lumbar region: Secondary | ICD-10-CM | POA: Diagnosis not present

## 2022-08-11 DIAGNOSIS — M9903 Segmental and somatic dysfunction of lumbar region: Secondary | ICD-10-CM | POA: Diagnosis not present

## 2022-08-11 DIAGNOSIS — M62830 Muscle spasm of back: Secondary | ICD-10-CM | POA: Diagnosis not present

## 2022-09-08 DIAGNOSIS — M5136 Other intervertebral disc degeneration, lumbar region: Secondary | ICD-10-CM | POA: Diagnosis not present

## 2022-09-08 DIAGNOSIS — M62830 Muscle spasm of back: Secondary | ICD-10-CM | POA: Diagnosis not present

## 2022-09-08 DIAGNOSIS — M9903 Segmental and somatic dysfunction of lumbar region: Secondary | ICD-10-CM | POA: Diagnosis not present

## 2022-09-10 DIAGNOSIS — H524 Presbyopia: Secondary | ICD-10-CM | POA: Diagnosis not present

## 2022-09-10 DIAGNOSIS — H52223 Regular astigmatism, bilateral: Secondary | ICD-10-CM | POA: Diagnosis not present

## 2022-09-10 DIAGNOSIS — Z0100 Encounter for examination of eyes and vision without abnormal findings: Secondary | ICD-10-CM | POA: Diagnosis not present

## 2022-09-13 ENCOUNTER — Telehealth: Payer: Self-pay | Admitting: Family Medicine

## 2022-09-13 DIAGNOSIS — I10 Essential (primary) hypertension: Secondary | ICD-10-CM

## 2022-09-13 NOTE — Telephone Encounter (Signed)
Pt need a refill on hydrochlorothiazide sent to [facility]

## 2022-09-14 MED ORDER — HYDROCHLOROTHIAZIDE 25 MG PO TABS: 12.5000 mg | ORAL_TABLET | Freq: Every day | ORAL | 3 refills | Status: DC

## 2022-09-14 NOTE — Telephone Encounter (Signed)
Refill sent pt is aware.

## 2022-09-26 ENCOUNTER — Other Ambulatory Visit: Payer: Self-pay | Admitting: Family Medicine

## 2022-09-26 DIAGNOSIS — I10 Essential (primary) hypertension: Secondary | ICD-10-CM

## 2022-10-07 IMAGING — DX DG CHEST 2V
2 series · 2 of 2 positions shown · non-contrast
Comparison: None.

CLINICAL DATA: Left chest pain.

EXAM:
CHEST - 2 VIEW

[chest pa]
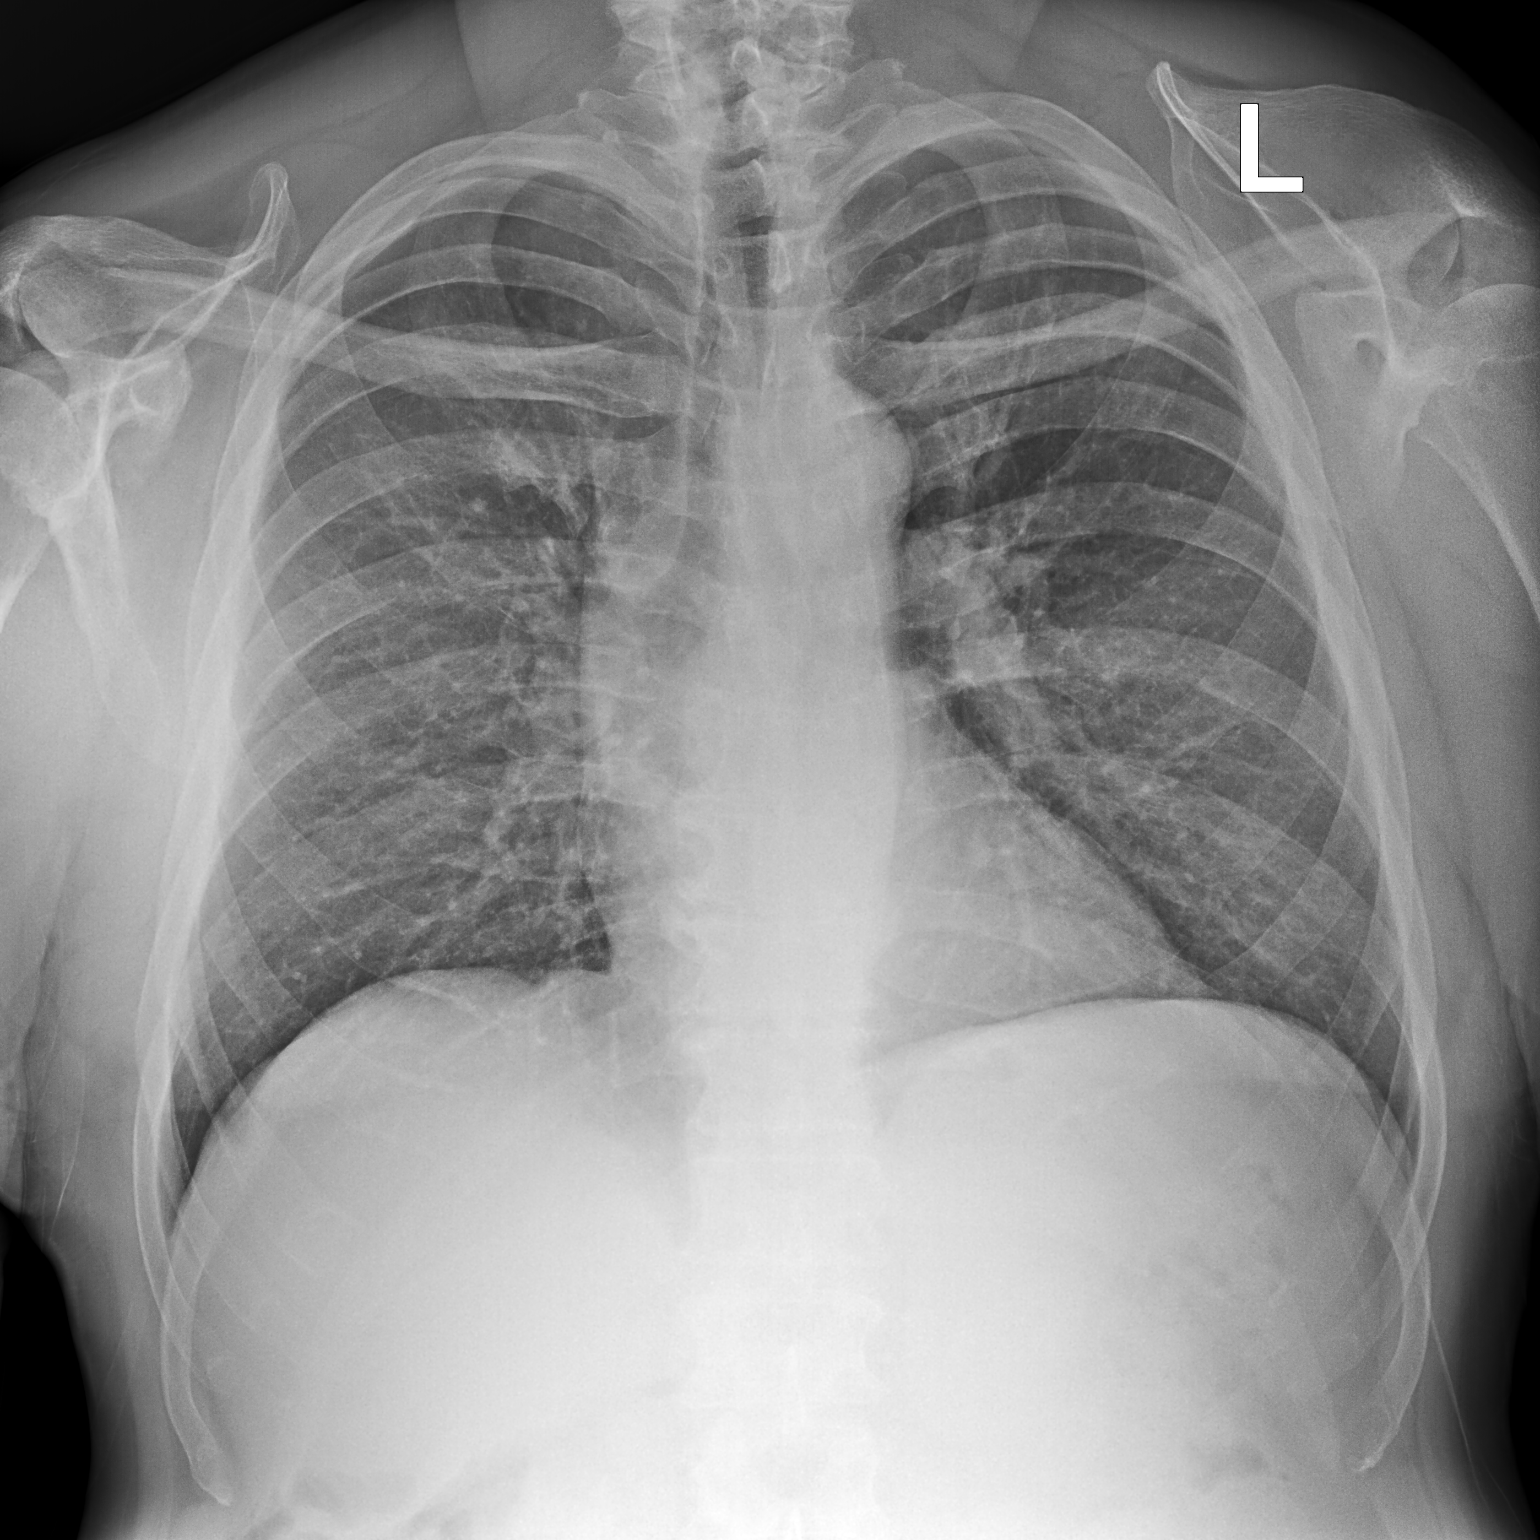

[chest lat]
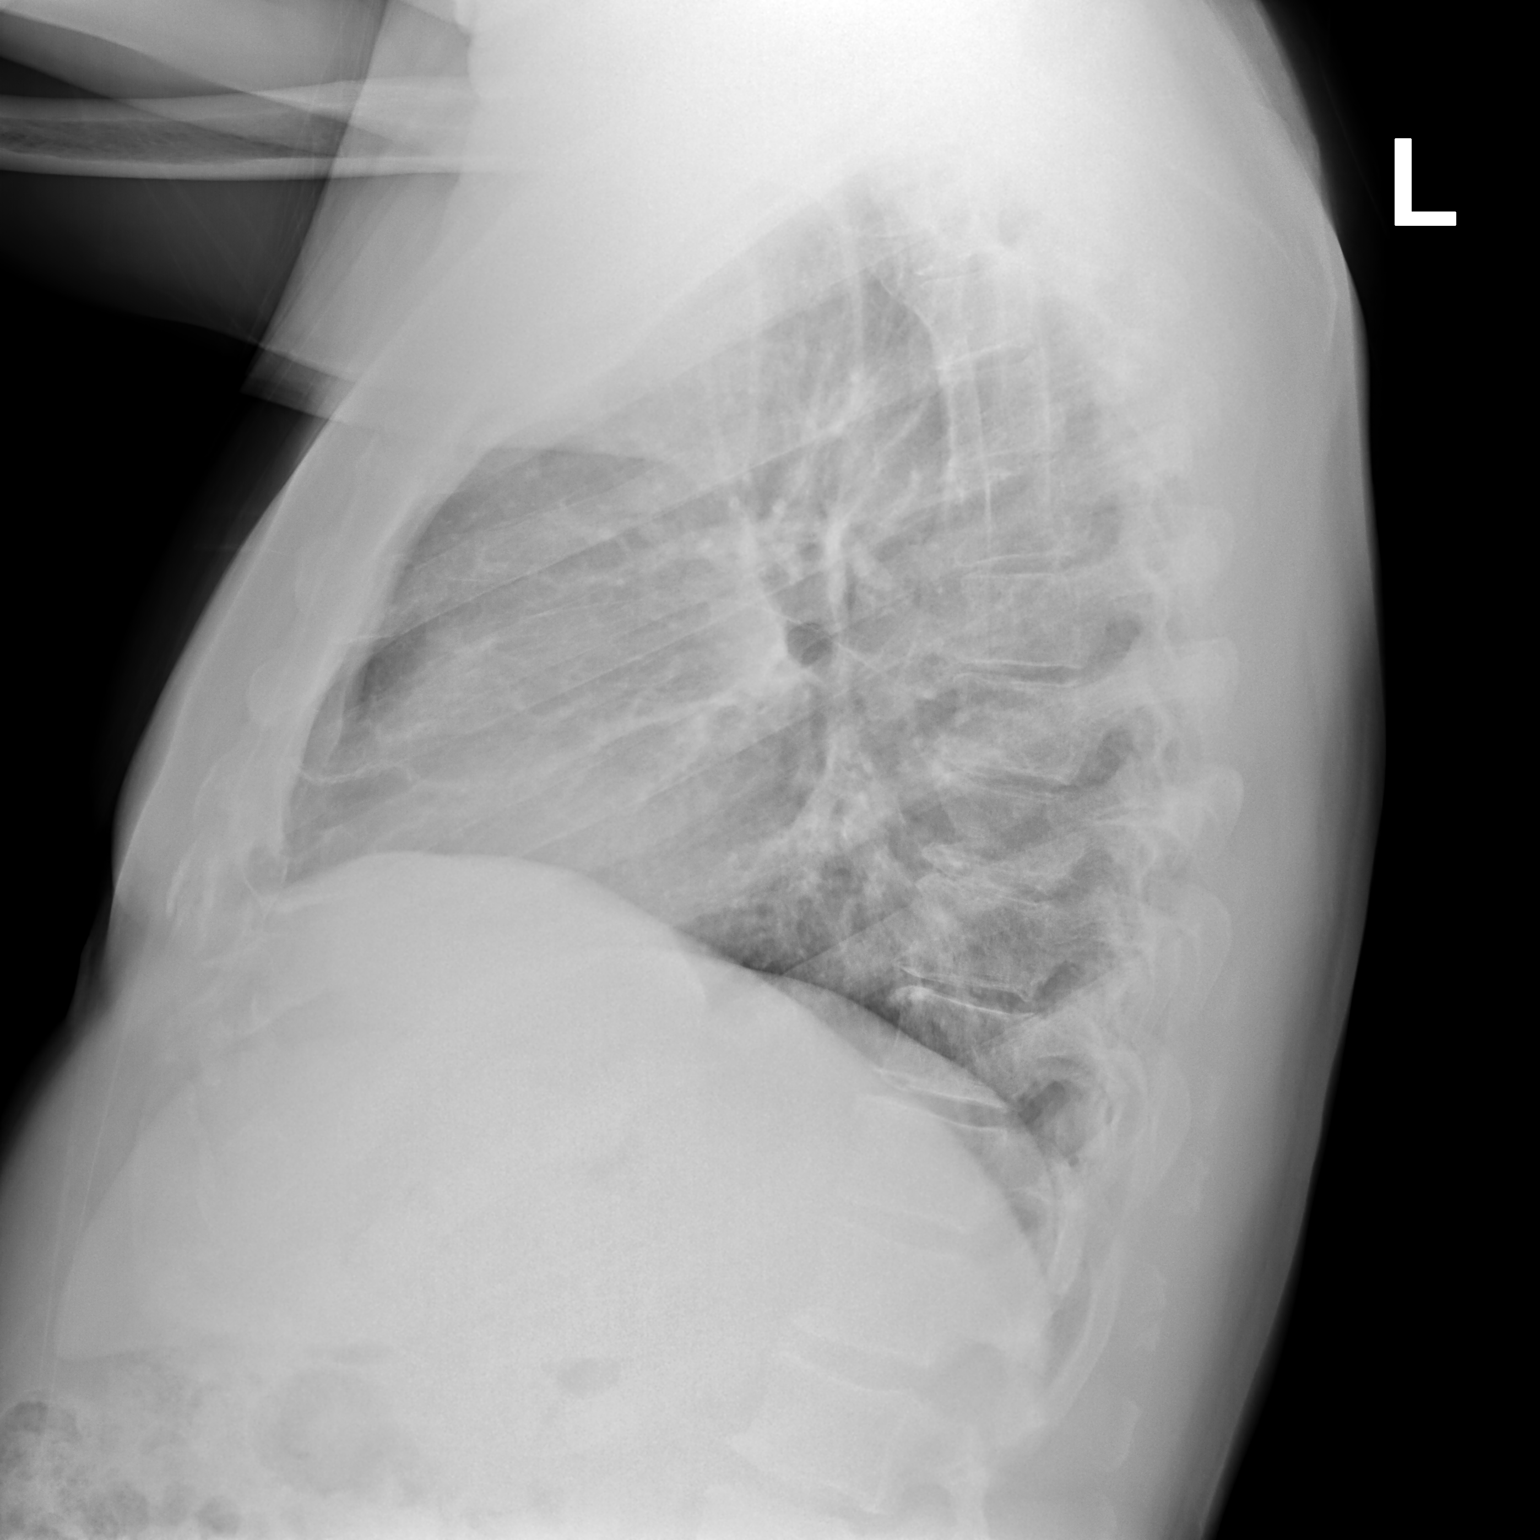

[2 of 2 positions shown; findings below may reference images not displayed]

FINDINGS: Lungs clear. Heart size normal. No pneumothorax or pleural fluid. No
acute or focal bony abnormality.
IMPRESSION: Negative chest.

## 2022-10-13 DIAGNOSIS — M9903 Segmental and somatic dysfunction of lumbar region: Secondary | ICD-10-CM | POA: Diagnosis not present

## 2022-10-13 DIAGNOSIS — M5136 Other intervertebral disc degeneration, lumbar region: Secondary | ICD-10-CM | POA: Diagnosis not present

## 2022-10-13 DIAGNOSIS — M5416 Radiculopathy, lumbar region: Secondary | ICD-10-CM | POA: Diagnosis not present

## 2022-10-13 DIAGNOSIS — M62830 Muscle spasm of back: Secondary | ICD-10-CM | POA: Diagnosis not present

## 2022-10-15 DIAGNOSIS — M62830 Muscle spasm of back: Secondary | ICD-10-CM | POA: Diagnosis not present

## 2022-10-15 DIAGNOSIS — M9903 Segmental and somatic dysfunction of lumbar region: Secondary | ICD-10-CM | POA: Diagnosis not present

## 2022-10-15 DIAGNOSIS — M5136 Other intervertebral disc degeneration, lumbar region: Secondary | ICD-10-CM | POA: Diagnosis not present

## 2022-10-15 DIAGNOSIS — M5416 Radiculopathy, lumbar region: Secondary | ICD-10-CM | POA: Diagnosis not present

## 2022-11-10 DIAGNOSIS — M5136 Other intervertebral disc degeneration, lumbar region: Secondary | ICD-10-CM | POA: Diagnosis not present

## 2022-11-10 DIAGNOSIS — M62830 Muscle spasm of back: Secondary | ICD-10-CM | POA: Diagnosis not present

## 2022-11-10 DIAGNOSIS — M5416 Radiculopathy, lumbar region: Secondary | ICD-10-CM | POA: Diagnosis not present

## 2022-11-10 DIAGNOSIS — M9903 Segmental and somatic dysfunction of lumbar region: Secondary | ICD-10-CM | POA: Diagnosis not present

## 2022-11-15 ENCOUNTER — Ambulatory Visit (INDEPENDENT_AMBULATORY_CARE_PROVIDER_SITE_OTHER): Payer: Medicare HMO | Admitting: Family Medicine

## 2022-11-15 ENCOUNTER — Encounter: Payer: Self-pay | Admitting: Family Medicine

## 2022-11-15 ENCOUNTER — Other Ambulatory Visit: Payer: Self-pay

## 2022-11-15 ENCOUNTER — Telehealth: Payer: Self-pay

## 2022-11-15 VITALS — BP 130/70 | HR 80 | Temp 98.2°F | Ht 69.0 in | Wt 209.2 lb

## 2022-11-15 DIAGNOSIS — E785 Hyperlipidemia, unspecified: Secondary | ICD-10-CM | POA: Diagnosis not present

## 2022-11-15 DIAGNOSIS — H6123 Impacted cerumen, bilateral: Secondary | ICD-10-CM

## 2022-11-15 DIAGNOSIS — I10 Essential (primary) hypertension: Secondary | ICD-10-CM

## 2022-11-15 DIAGNOSIS — Z1211 Encounter for screening for malignant neoplasm of colon: Secondary | ICD-10-CM | POA: Diagnosis not present

## 2022-11-15 DIAGNOSIS — R7303 Prediabetes: Secondary | ICD-10-CM

## 2022-11-15 DIAGNOSIS — Z23 Encounter for immunization: Secondary | ICD-10-CM

## 2022-11-15 DIAGNOSIS — Z125 Encounter for screening for malignant neoplasm of prostate: Secondary | ICD-10-CM | POA: Diagnosis not present

## 2022-11-15 MED ORDER — LISINOPRIL 30 MG PO TABS: 30.0000 mg | ORAL_TABLET | Freq: Every day | ORAL | 2 refills | Status: DC

## 2022-11-15 MED ORDER — NA SULFATE-K SULFATE-MG SULF 17.5-3.13-1.6 GM/177ML PO SOLN: 1.0000 | Freq: Once | ORAL | 0 refills | Status: AC

## 2022-11-15 NOTE — Assessment & Plan Note (Signed)
Check lipid panel.  Continue Lipitor 40 mg daily.

## 2022-11-15 NOTE — Patient Instructions (Signed)
Nice to see you. We will increase your lisinopril to 30 mg daily.  You can take 1.5 pills of the 20 mg tablet to equal this dose until you get your 30 mg tablet. Will have you return in 1 week for labs.  Please let me know if you start to get lightheaded with the dose increase of lisinopril.

## 2022-11-15 NOTE — Assessment & Plan Note (Signed)
A little bit above goal.  We previously reduced his lisinopril and HCTZ doses.  Will increase his lisinopril to 30 mg daily.  He will continue HCTZ 12.5 mg daily.  If he starts to get lightheaded again he will let us know.  He will return in 1 week for lab work.

## 2022-11-15 NOTE — Progress Notes (Signed)
Gabriel Rumps, MD Phone: [phone]  SAURAV CRUMBLE is a 67 y.o. male who presents today for f/u.  HYPERTENSION Disease Monitoring Home BP Monitoring 130s/70s mostly Chest pain- no    Dyspnea- no Medications Compliance-  taking HCTZ, lisinopril. Lightheadedness-  no  Edema- no BMET    Component Value Date/Time   NA 137 05/11/2022 0827   K 4.4 05/11/2022 0827   CL 105 05/11/2022 0827   CO2 24 05/11/2022 0827   GLUCOSE 116 (H) 05/11/2022 0827   BUN 20 05/11/2022 0827   CREATININE 1.13 05/11/2022 0827   CREATININE 1.27 (H) 10/17/2020 1523   CALCIUM 9.1 05/11/2022 0827   HYPERLIPIDEMIA Symptoms Chest pain on exertion:  no    Medications: Compliance- taking lipitor Right upper quadrant pain- no  Muscle aches- no Tries to balance out food choices with good food choices.  Does tend to lean more towards bad food choices.  Walks daily and does some weights. Lipid Panel     Component Value Date/Time   CHOL 144 11/09/2021 0734   TRIG 139.0 11/09/2021 0734   HDL 30.50 (L) 11/09/2021 0734   CHOLHDL 5 11/09/2021 0734   VLDL 27.8 11/09/2021 0734   LDLCALC 85 11/09/2021 0734   LDLCALC 87 10/17/2020 1523   LDLDIRECT 83.0 03/11/2020 0807   Cerumen impaction: Patient feels that he has a lot of earwax at this time.  He wants these checked and cleaned out if needed.  Social History   Tobacco Use  Smoking Status Former   Packs/day: 0.25   Types: Cigarettes  Smokeless Tobacco Never  Tobacco Comments   smokes 2 packs a week. 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 Swallow whole.     atorvastatin (LIPITOR) 40 MG tablet TAKE 1 TABLET BY MOUTH EVERY DAY 90 tablet 3   hydrochlorothiazide (HYDRODIURIL) 25 MG tablet Take 0.5 tablets (12.5 mg total) by mouth daily. 45 tablet 3   No current facility-administered medications on file prior to visit.      ROS see history of present illness  Objective  Physical Exam Vitals:   11/15/22 0811 11/15/22 0845  BP: 130/70 130/70  Pulse: 80   Temp: 98.2 F (36.8 C)   SpO2: 99%     BP Readings from Last 3 Encounters:  11/15/22 130/70  05/11/22 130/78  11/11/21 120/80   Wt Readings from Last 3 Encounters:  11/15/22 209 lb 3.2 oz (94.9 kg)  05/11/22 204 lb 3.2 oz (92.6 kg)  03/15/22 211 lb (95.7 kg)    Physical Exam Constitutional:      General: He is not in acute distress.    Appearance: He is not diaphoretic.  HENT:     Ears:     Comments: Bilateral ear canals with cerumen Cardiovascular:     Rate and Rhythm: Normal rate and regular rhythm.     Heart sounds: Normal heart sounds.  Pulmonary:     Effort: Pulmonary effort is normal.     Breath sounds: Normal breath sounds.  Lymphadenopathy:     Cervical: No cervical adenopathy.  Skin:    General: Skin is warm and dry.  Neurological:     Mental Status: He is alert.      Assessment/Plan: Please see individual problem list.  Primary hypertension Assessment & Plan: A little bit above goal.  We previously reduced  his lisinopril and HCTZ doses.  Will increase his lisinopril to 30 mg daily.  He will continue HCTZ 12.5 mg daily.  If he starts to get lightheaded again he will let us know.  He will return in 1 week for lab work.  Orders: -     Lipid panel; Future -     Comprehensive metabolic panel; Future -     Lisinopril; Take 1 tablet (30 mg total) by mouth daily.  Dispense: 90 tablet; Refill: 2  Hyperlipidemia, unspecified hyperlipidemia type Assessment & Plan: Check lipid panel.  Continue Lipitor 40 mg daily.  Orders: -     Lipid panel; Future -     Comprehensive metabolic panel; Future  Prediabetes Assessment & Plan: Check A1c.  Encouraged eating some healthier options and continuing with exercise.  Orders: -     Hemoglobin A1c; Future  Colon cancer screening -     Ambulatory referral to  Gastroenterology  Prostate cancer screening -     PSA; Future  Bilateral impacted cerumen Assessment & Plan: Irrigated by CMA and tolerated well by the patient.  Cerumen removed from the right ear canal revealing normal TM.  Left ear canal was not successfully irrigated.  Patient was advised to use Debrox on the left side and if not improving we could refer to ENT.      Health Maintenance: GI referral placed for colon cancer screening.  Return in about 1 week (around 11/22/2022) for Labs, 3 months PCP for blood pressure follow-up.   Gabriel Rumps, MD [place]

## 2022-11-15 NOTE — Assessment & Plan Note (Signed)
Irrigated by [employer] and tolerated well by the patient.  Cerumen removed from the right ear canal revealing normal TM.  Left ear canal was not successfully irrigated.  Patient was advised to use Debrox on the left side and if not improving we could refer to ENT.

## 2022-11-15 NOTE — Assessment & Plan Note (Signed)
Check A1c.  Encouraged eating some healthier options and continuing with exercise.

## 2022-11-15 NOTE — Telephone Encounter (Signed)
Gastroenterology Pre-Procedure Review  Request Date: 11/23/22 Requesting Physician: Dr. Allen Norris  PATIENT REVIEW QUESTIONS: The patient responded to the following health history questions as indicated:    1. Are you having any GI issues? no 2. Do you have a personal history of Polyps? no 3. Do you have a family history of Colon Cancer or Polyps? no 4. Diabetes Mellitus? no 5. Joint replacements in the past 12 months?no 6. Major health problems in the past 3 months?no 7. Any artificial heart valves, MVP, or defibrillator?no    MEDICATIONS & ALLERGIES:    Patient reports the following regarding taking any anticoagulation/antiplatelet therapy:   Plavix, Coumadin, Eliquis, Xarelto, Lovenox, Pradaxa, Brilinta, or Effient? no Aspirin? no  Patient confirms/reports the following medications:  Current Outpatient Medications  Medication Sig Dispense Refill   aspirin EC 81 MG tablet Take 81 mg by mouth daily. Swallow whole.     atorvastatin (LIPITOR) 40 MG tablet TAKE 1 TABLET BY MOUTH EVERY DAY 90 tablet 3   hydrochlorothiazide (HYDRODIURIL) 25 MG tablet Take 0.5 tablets (12.5 mg total) by mouth daily. 45 tablet 3   lisinopril (ZESTRIL) 30 MG tablet Take 1 tablet (30 mg total) by mouth daily. 90 tablet 2   No current facility-administered medications for this visit.    Patient confirms/reports the following allergies:  No Known Allergies  No orders of the defined types were placed in this encounter.   AUTHORIZATION INFORMATION Primary Insurance: 1D#: Group #:  Secondary Insurance: 1D#: Group #:  SCHEDULE INFORMATION: Date: 11/23/22  Time: Location: [facility]

## 2022-11-22 ENCOUNTER — Other Ambulatory Visit (INDEPENDENT_AMBULATORY_CARE_PROVIDER_SITE_OTHER): Payer: Medicare HMO

## 2022-11-22 ENCOUNTER — Ambulatory Visit: Payer: Medicare HMO

## 2022-11-22 DIAGNOSIS — Z125 Encounter for screening for malignant neoplasm of prostate: Secondary | ICD-10-CM

## 2022-11-22 DIAGNOSIS — I10 Essential (primary) hypertension: Secondary | ICD-10-CM | POA: Diagnosis not present

## 2022-11-22 DIAGNOSIS — R7303 Prediabetes: Secondary | ICD-10-CM

## 2022-11-22 DIAGNOSIS — E785 Hyperlipidemia, unspecified: Secondary | ICD-10-CM

## 2022-11-22 LAB — LIPID PANEL
Cholesterol: 154 mg/dL (ref 0–200)
HDL: 32.6 mg/dL — ABNORMAL LOW (ref >39.00)
LDL Cholesterol: 94 mg/dL (ref 0–99)
NonHDL: 121.35
Total CHOL/HDL Ratio: 5
Triglycerides: 138 mg/dL (ref 0.0–149.0)
VLDL: 27.6 mg/dL (ref 0.0–40.0)

## 2022-11-22 LAB — COMPREHENSIVE METABOLIC PANEL
ALT: 31 U/L (ref 0–53)
AST: 20 U/L (ref 0–37)
Albumin: 4.4 g/dL (ref 3.5–5.2)
Alkaline Phosphatase: 70 U/L (ref 39–117)
BUN: 27 mg/dL — ABNORMAL HIGH (ref 6–23)
CO2: 29 mEq/L (ref 19–32)
Calcium: 9.7 mg/dL (ref 8.4–10.5)
Chloride: 101 mEq/L (ref 96–112)
Creatinine, Ser: 1.27 mg/dL (ref 0.40–1.50)
GFR: 58.65 mL/min — ABNORMAL LOW (ref >60.00)
Glucose, Bld: 106 mg/dL — ABNORMAL HIGH (ref 70–99)
Potassium: 4.7 mEq/L (ref 3.5–5.1)
Sodium: 138 mEq/L (ref 135–145)
Total Bilirubin: 0.9 mg/dL (ref 0.2–1.2)
Total Protein: 7.1 g/dL (ref 6.0–8.3)

## 2022-11-22 LAB — HEMOGLOBIN A1C: Hgb A1c MFr Bld: 5.5 % (ref 4.6–6.5)

## 2022-11-22 LAB — PSA: PSA: 0.37 ng/mL (ref 0.10–4.00)

## 2022-11-23 ENCOUNTER — Encounter: Payer: Self-pay | Admitting: Gastroenterology

## 2022-11-23 ENCOUNTER — Ambulatory Visit
Admission: RE | Admit: 2022-11-23 | Discharge: 2022-11-23 | Disposition: A | Discharge status: Home / Self Care | Payer: Medicare HMO | Attending: Gastroenterology | Admitting: Gastroenterology

## 2022-11-23 ENCOUNTER — Ambulatory Visit: Payer: Medicare HMO | Admitting: Anesthesiology

## 2022-11-23 ENCOUNTER — Encounter
Admission: RE | Disposition: A | Discharge status: Home / Self Care | Payer: Self-pay | Source: Home / Self Care | Attending: Gastroenterology

## 2022-11-23 DIAGNOSIS — K5730 Diverticulosis of large intestine without perforation or abscess without bleeding: Secondary | ICD-10-CM | POA: Insufficient documentation

## 2022-11-23 DIAGNOSIS — K640 First degree hemorrhoids: Secondary | ICD-10-CM | POA: Diagnosis not present

## 2022-11-23 DIAGNOSIS — Z87891 Personal history of nicotine dependence: Secondary | ICD-10-CM | POA: Diagnosis not present

## 2022-11-23 DIAGNOSIS — I10 Essential (primary) hypertension: Secondary | ICD-10-CM | POA: Diagnosis not present

## 2022-11-23 DIAGNOSIS — Z1211 Encounter for screening for malignant neoplasm of colon: Secondary | ICD-10-CM | POA: Diagnosis not present

## 2022-11-23 HISTORY — PX: COLONOSCOPY WITH PROPOFOL: SHX5780

## 2022-11-23 SURGERY — COLONOSCOPY WITH PROPOFOL
Anesthesia: General

## 2022-11-23 MED ORDER — LIDOCAINE 2% (20 MG/ML) 5 ML SYRINGE
INTRAMUSCULAR | Status: DC | PRN
  Administered 2022-11-23: 50 mg via INTRAVENOUS

## 2022-11-23 MED ORDER — PROPOFOL 1000 MG/100ML IV EMUL
INTRAVENOUS | Status: AC
  Filled 2022-11-23: qty 100

## 2022-11-23 MED ORDER — PROPOFOL 500 MG/50ML IV EMUL
INTRAVENOUS | Status: DC | PRN
  Administered 2022-11-23: 150 ug/kg/min via INTRAVENOUS

## 2022-11-23 MED ORDER — PROPOFOL 10 MG/ML IV BOLUS
INTRAVENOUS | Status: DC | PRN
  Administered 2022-11-23: 80 mg via INTRAVENOUS

## 2022-11-23 MED ORDER — SODIUM CHLORIDE 0.9 % IV SOLN: INTRAVENOUS | Status: DC

## 2022-11-23 NOTE — H&P (Signed)
Lucilla Lame, MD [facility] [address] [place], [place] [address] Phone: [phone] Fax : [phone]  Primary Care Physician:  Leone Haven, MD Primary Gastroenterologist:  Dr. Allen Norris  Pre-Procedure History & Physical: HPI:  Gabriel Camacho is a 67 y.o. male is here for a screening colonoscopy.   Past Medical History:  Diagnosis Date   Allergy    Chicken pox     Past Surgical History:  Procedure Laterality Date   WRIST SURGERY Left     Prior to Admission medications   Medication Sig Start Date End Date Taking? Authorizing Provider  aspirin EC 81 MG tablet Take 81 mg by mouth daily. Swallow whole.   Yes [provider]  atorvastatin (LIPITOR) 40 MG tablet TAKE 1 TABLET BY MOUTH EVERY DAY 12/30/21  Yes Dutch Quint B, FNP  hydrochlorothiazide (HYDRODIURIL) 25 MG tablet Take 0.5 tablets (12.5 mg total) by mouth daily. 09/14/22  Yes Leone Haven, MD  lisinopril (ZESTRIL) 30 MG tablet Take 1 tablet (30 mg total) by mouth daily. 11/15/22  Yes Leone Haven, MD    Allergies as of 11/16/2022   (No Known Allergies)    History reviewed. No pertinent family history.  Social History   Socioeconomic History   Marital status: Married    Spouse name: Not on file   Number of children: Not on file   Years of education: Not on file   Highest education level: Not on file  Occupational History   Not on file  Tobacco Use   Smoking status: Former    Packs/day: 0.25    Types: Cigarettes   Smokeless tobacco: Never   Tobacco comments:    smokes 2 packs a week. 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  Scientific laboratory technician Use: Never used  Substance and Sexual Activity   Alcohol use: No    Alcohol/week: 0.0 standard drinks of alcohol   Drug use: No   Sexual activity: Not on file  Other Topics Concern   Not on file  Social History Narrative   Not on file   Social Determinants of Health   Financial Resource Strain: Low Risk  (03/15/2022)   Overall Financial  Resource Strain (CARDIA)    Difficulty of Paying Living Expenses: Not hard at all  Food Insecurity: No Food Insecurity (03/15/2022)   Hunger Vital Sign    Worried About Running Out of Food in the Last Year: Never true    Ran Out of Food in the Last Year: Never true  Transportation Needs: No Transportation Needs (03/15/2022)   PRAPARE - Hydrologist (Medical): No    Lack of Transportation (Non-Medical): No  Physical Activity: Not on file  Stress: No Stress Concern Present (03/15/2022)   [place]    Feeling of Stress : Not at all  Social Connections: Unknown (03/15/2022)   Social Connection and Isolation Panel [NHANES]    Frequency of Communication with Friends and Family: Not on file    Frequency of Social Gatherings with Friends and Family: Not on file    Attends Religious Services: Not on file    Active Member of Clubs or Organizations: Not on file    Attends Archivist Meetings: Not on file    Marital Status: Married  Intimate Partner Violence: Not At Risk (03/15/2022)   Humiliation, Afraid, Rape, and Kick questionnaire    Fear of Current or Ex-Partner: No  Emotionally Abused: No    Physically Abused: No    Sexually Abused: No    Review of Systems: See HPI, otherwise negative ROS  Physical Exam: BP (!) 149/103   Pulse 96   Temp (!) 97.5 F (36.4 C) (Temporal)   Resp 18   Ht 5' 9"$  (1.753 m)   Wt 91.6 kg   SpO2 99%   BMI 29.83 kg/m  General:   Alert,  pleasant and cooperative in NAD Head:  Normocephalic and atraumatic. Neck:  Supple; no masses or thyromegaly. Lungs:  Clear throughout to auscultation.    Heart:  Regular rate and rhythm. Abdomen:  Soft, nontender and nondistended. Normal bowel sounds, without guarding, and without rebound.   Neurologic:  Alert and  oriented x4;  grossly normal neurologically.  Impression/Plan: Gabriel Camacho is now here to undergo a  screening colonoscopy.  Risks, benefits, and alternatives regarding colonoscopy have been reviewed with the patient.  Questions have been answered.  All parties agreeable.

## 2022-11-23 NOTE — Transfer of Care (Signed)
Immediate Anesthesia Transfer of Care Note  Patient: Gabriel Camacho  Procedure(s) Performed: COLONOSCOPY WITH PROPOFOL  Patient Location: Endoscopy Unit  Anesthesia Type:General  Level of Consciousness: drowsy  Airway & Oxygen Therapy: Patient Spontanous Breathing and Patient connected to nasal cannula oxygen  Post-op Assessment: Report given to RN, Post -op Vital signs reviewed and stable, and Patient moving all extremities  Post vital signs: Reviewed and stable  Last Vitals:  Vitals Value Taken Time  BP    Temp    Pulse 80 11/23/22 0753  Resp 16 11/23/22 0753  SpO2 93 % 11/23/22 0753  Vitals shown include unvalidated device data.  Last Pain:  Vitals:   11/23/22 0750  TempSrc:   PainSc: 0-No pain         Complications: No notable events documented.

## 2022-11-23 NOTE — Anesthesia Postprocedure Evaluation (Signed)
Anesthesia Post Note  Patient: STEVE KRENZ  Procedure(s) Performed: COLONOSCOPY WITH PROPOFOL  Patient location during evaluation: PACU Anesthesia Type: General Level of consciousness: awake and alert, oriented and patient cooperative Pain management: pain level controlled Vital Signs Assessment: post-procedure vital signs reviewed and stable Respiratory status: spontaneous breathing, nonlabored ventilation and respiratory function stable Cardiovascular status: blood pressure returned to baseline and stable Postop Assessment: adequate PO intake Anesthetic complications: no   No notable events documented.   Last Vitals:  Vitals:   11/23/22 0800 11/23/22 0810  BP: 121/88 (!) 129/90  Pulse: 72 78  Resp: 15 17  Temp:    SpO2: 94% 96%    Last Pain:  Vitals:   11/23/22 0810  TempSrc:   PainSc: 0-No pain                 Darrin Nipper

## 2022-11-23 NOTE — Anesthesia Preprocedure Evaluation (Addendum)
Anesthesia Evaluation  Patient identified by MRN, date of birth, ID band Patient awake    Reviewed: Allergy & Precautions, NPO status , Patient's Chart, lab work & pertinent test results  History of Anesthesia Complications Negative for: history of anesthetic complications  Airway Mallampati: II   Neck ROM: Full    Dental  (+) Missing   Pulmonary former smoker (quit greater than 10 years ago)   Pulmonary exam normal breath sounds clear to auscultation       Cardiovascular hypertension, Normal cardiovascular exam Rhythm:Regular Rate:Normal     Neuro/Psych negative neurological ROS     GI/Hepatic negative GI ROS,,,  Endo/Other  Prediabetes   Renal/GU negative Renal ROS     Musculoskeletal   Abdominal   Peds  Hematology negative hematology ROS (+)   Anesthesia Other Findings   Reproductive/Obstetrics                             Anesthesia Physical Anesthesia Plan  ASA: 2  Anesthesia Plan: General   Post-op Pain Management:    Induction: Intravenous  PONV Risk Score and Plan: 2 and Propofol infusion, TIVA and Treatment may vary due to age or medical condition  Airway Management Planned: Natural Airway  Additional Equipment:   Intra-op Plan:   Post-operative Plan:   Informed Consent: I have reviewed the patients History and Physical, chart, labs and discussed the procedure including the risks, benefits and alternatives for the proposed anesthesia with the patient or authorized representative who has indicated his/her understanding and acceptance.       Plan Discussed with: CRNA  Anesthesia Plan Comments: (LMA/GETA backup discussed.  Patient consented for risks of anesthesia including but not limited to:  - adverse reactions to medications - damage to eyes, teeth, lips or other oral mucosa - nerve damage due to positioning  - sore throat or hoarseness - damage to heart,  brain, nerves, lungs, other parts of body or loss of life  Informed patient about role of CRNA in peri- and intra-operative care.  Patient voiced understanding.)        Anesthesia Quick Evaluation

## 2022-11-23 NOTE — Op Note (Signed)
[facility] Gastroenterology Patient Name: Gabriel Camacho Procedure Date: 11/23/2022 7:26 AM MRN: [id number] Account #: [id number] Date of Birth: Feb 07, 1956 Admit Type: Outpatient Age: 67 Room: [facility] ENDO ROOM 4 Gender: Male Note Status: Finalized Instrument Name: Jasper Riling [id number] Procedure:             Colonoscopy Indications:           Screening for colorectal malignant neoplasm Providers:             Lucilla Lame MD, MD Referring MD:          Angela Adam. Caryl Bis (Referring MD) Medicines:             Propofol per Anesthesia Complications:         No immediate complications. Procedure:             Pre-Anesthesia Assessment:                        - Prior to the procedure, a History and Physical was                         performed, and patient medications and allergies were                         reviewed. The patient's tolerance of previous                         anesthesia was also reviewed. The risks and benefits                         of the procedure and the sedation options and risks                         were discussed with the patient. All questions were                         answered, and informed consent was obtained. Prior                         Anticoagulants: The patient has taken no anticoagulant                         or antiplatelet agents. ASA Grade Assessment: II - A                         patient with mild systemic disease. After reviewing                         the risks and benefits, the patient was deemed in                         satisfactory condition to undergo the procedure.                        After obtaining informed consent, the colonoscope was                         passed under direct vision. Throughout the procedure,  the patient's blood pressure, pulse, and oxygen                         saturations were monitored continuously. The                         Colonoscope was introduced through the  anus and                         advanced to the the cecum, identified by appendiceal                         orifice and ileocecal valve. The colonoscopy was                         performed without difficulty. The patient tolerated                         the procedure well. The quality of the bowel                         preparation was excellent. Findings:      The perianal and digital rectal examinations were normal.      A few small-mouthed diverticula were found in the sigmoid colon.      Non-bleeding internal hemorrhoids were found during retroflexion. The       hemorrhoids were Grade I (internal hemorrhoids that do not prolapse). Impression:            - Diverticulosis in the sigmoid colon.                        - Non-bleeding internal hemorrhoids.                        - No specimens collected. Recommendation:        - Discharge patient to home.                        - Resume previous diet.                        - Continue present medications.                        - Await pathology results.                        - Repeat colonoscopy for surveillance.                        - Repeat colonoscopy in 10 years for screening                         purposes. Procedure Code(s):     --- Professional ---                        [phone], Colonoscopy, flexible; diagnostic, including                         collection of specimen(s) by brushing or washing, when  performed (separate procedure) Diagnosis Code(s):     --- Professional ---                        Z12.11, Encounter for screening for malignant neoplasm                         of colon CPT copyright 2022 American Medical Association. All rights reserved. The codes documented in this report are preliminary and upon coder review may  be revised to meet current compliance requirements. Lucilla Lame MD, MD 11/23/2022 7:48:31 AM This report has been signed electronically. Number of Addenda: 0 Note  Initiated On: 11/23/2022 7:26 AM Scope Withdrawal Time: 0 hours 6 minutes 21 seconds  Total Procedure Duration: 0 hours 10 minutes 50 seconds  Estimated Blood Loss:  Estimated blood loss: none.      [facility]

## 2022-11-24 ENCOUNTER — Encounter: Payer: Self-pay | Admitting: Gastroenterology

## 2022-12-03 DIAGNOSIS — M62830 Muscle spasm of back: Secondary | ICD-10-CM | POA: Diagnosis not present

## 2022-12-03 DIAGNOSIS — M9903 Segmental and somatic dysfunction of lumbar region: Secondary | ICD-10-CM | POA: Diagnosis not present

## 2022-12-03 DIAGNOSIS — M5416 Radiculopathy, lumbar region: Secondary | ICD-10-CM | POA: Diagnosis not present

## 2022-12-03 DIAGNOSIS — M5136 Other intervertebral disc degeneration, lumbar region: Secondary | ICD-10-CM | POA: Diagnosis not present

## 2022-12-28 ENCOUNTER — Telehealth: Payer: Self-pay | Admitting: Family

## 2022-12-29 DIAGNOSIS — M5416 Radiculopathy, lumbar region: Secondary | ICD-10-CM | POA: Diagnosis not present

## 2022-12-29 DIAGNOSIS — M9903 Segmental and somatic dysfunction of lumbar region: Secondary | ICD-10-CM | POA: Diagnosis not present

## 2022-12-29 DIAGNOSIS — M62830 Muscle spasm of back: Secondary | ICD-10-CM | POA: Diagnosis not present

## 2022-12-29 DIAGNOSIS — M5136 Other intervertebral disc degeneration, lumbar region: Secondary | ICD-10-CM | POA: Diagnosis not present

## 2022-12-30 NOTE — Telephone Encounter (Signed)
Pt need a refill on atorvastatin sent to [facility]

## 2023-01-06 NOTE — Telephone Encounter (Signed)
Medication has been refilled.

## 2023-01-11 ENCOUNTER — Telehealth: Payer: Self-pay | Admitting: Family Medicine

## 2023-01-11 NOTE — Telephone Encounter (Signed)
Pt need a refill on hydrochlorothiazide [facility] to [facility] 

## 2023-01-12 ENCOUNTER — Other Ambulatory Visit: Payer: Self-pay

## 2023-01-12 DIAGNOSIS — I10 Essential (primary) hypertension: Secondary | ICD-10-CM

## 2023-01-12 MED ORDER — HYDROCHLOROTHIAZIDE 25 MG PO TABS: 12.5000 mg | ORAL_TABLET | Freq: Every day | ORAL | 3 refills | Status: DC

## 2023-01-12 NOTE — Telephone Encounter (Signed)
HCTZ sent to [facility]

## 2023-01-17 DIAGNOSIS — Z8249 Family history of ischemic heart disease and other diseases of the circulatory system: Secondary | ICD-10-CM | POA: Diagnosis not present

## 2023-01-17 DIAGNOSIS — I10 Essential (primary) hypertension: Secondary | ICD-10-CM | POA: Diagnosis not present

## 2023-01-17 DIAGNOSIS — Z87891 Personal history of nicotine dependence: Secondary | ICD-10-CM | POA: Diagnosis not present

## 2023-01-17 DIAGNOSIS — E785 Hyperlipidemia, unspecified: Secondary | ICD-10-CM | POA: Diagnosis not present

## 2023-01-26 DIAGNOSIS — M5136 Other intervertebral disc degeneration, lumbar region: Secondary | ICD-10-CM | POA: Diagnosis not present

## 2023-01-26 DIAGNOSIS — M62830 Muscle spasm of back: Secondary | ICD-10-CM | POA: Diagnosis not present

## 2023-01-26 DIAGNOSIS — M5416 Radiculopathy, lumbar region: Secondary | ICD-10-CM | POA: Diagnosis not present

## 2023-01-26 DIAGNOSIS — M9903 Segmental and somatic dysfunction of lumbar region: Secondary | ICD-10-CM | POA: Diagnosis not present

## 2023-02-04 ENCOUNTER — Ambulatory Visit
Admission: RE | Admit: 2023-02-04 | Discharge: 2023-02-04 | Disposition: A | Discharge status: Home / Self Care | Payer: Medicare HMO | Source: Ambulatory Visit | Attending: Family Medicine | Admitting: Family Medicine

## 2023-02-04 VITALS — BP 165/90 | HR 93 | Temp 98.4°F | Resp 18

## 2023-02-04 DIAGNOSIS — M25561 Pain in right knee: Secondary | ICD-10-CM | POA: Diagnosis not present

## 2023-02-04 DIAGNOSIS — L03115 Cellulitis of right lower limb: Secondary | ICD-10-CM | POA: Diagnosis not present

## 2023-02-04 NOTE — Discharge Instructions (Signed)
Follow-up by scheduling evaluation with an orthopedic provider.

## 2023-02-04 NOTE — ED Triage Notes (Signed)
Patient presents to UC for right knee injury x 1 day. Stays he was on knees yesterday, when he took a nap and got up he was in pain. Taking ibuprofen.

## 2023-02-04 NOTE — ED Provider Notes (Signed)
Gabriel Camacho    CSN: [id number] Arrival date & time: 02/04/23  1144      History   Chief Complaint Chief Complaint  Patient presents with   Knee Injury    Entered by patient    HPI Gabriel Camacho is a 67 y.o. male.   HPI  Presents to urgent care with right knee injury x 1 day.  Patient endorses working on his knees yesterday, after which he took a nap and awoke in pain.  Past Medical History:  Diagnosis Date   Allergy    Chicken pox     Patient Active Problem List   Diagnosis Date Noted   Encounter for screening colonoscopy 11/23/2022   Inguinal hernia 05/11/2022   Obesity (BMI 30-39.9) 11/11/2021   Cerumen impaction 11/10/2020   Prediabetes 02/27/2018   HLD (hyperlipidemia) 11/28/2017   HTN (hypertension) 04/12/2016   Routine general medical examination at a health care facility 03/10/2016    Past Surgical History:  Procedure Laterality Date   COLONOSCOPY WITH PROPOFOL N/A 11/23/2022   Procedure: COLONOSCOPY WITH PROPOFOL;  Surgeon: Midge Minium, MD;  Location: [facility] ENDOSCOPY;  Service: Endoscopy;  Laterality: N/A;   WRIST SURGERY Left        Home Medications    Prior to Admission medications   Medication Sig Start Date End Date Taking? Authorizing Provider  aspirin EC 81 MG tablet Take 81 mg by mouth daily. Swallow whole.    [provider]  atorvastatin (LIPITOR) 40 MG tablet TAKE 1 TABLET BY MOUTH EVERY DAY 01/06/23   Gabriel Luis, MD  hydrochlorothiazide (HYDRODIURIL) 25 MG tablet Take 0.5 tablets (12.5 mg total) by mouth daily. 01/12/23   Gabriel Luis, MD  lisinopril (ZESTRIL) 30 MG tablet Take 1 tablet (30 mg total) by mouth daily. 11/15/22   Gabriel Luis, MD    Family History History reviewed. No pertinent family history.  Social History Social History   Tobacco Use   Smoking status: Former    Packs/day: .25    Types: Cigarettes   Smokeless tobacco: Never   Tobacco comments:    smokes 2 packs a week. 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   Review of Systems Review of Systems   Physical Exam Triage Vital Signs ED Triage Vitals [02/04/23 1204]  Enc Vitals Group     BP (!) 165/90     Pulse Rate 93     Resp 18     Temp 98.4 F (36.9 C)     Temp Source Oral     SpO2 97 %     Weight      Height      Head Circumference      Peak Flow      Pain Score      Pain Loc      Pain Edu?      Excl. in GC?    No data found.  Updated Vital Signs BP (!) 165/90 (BP Location: Left Arm)   Pulse 93   Temp 98.4 F (36.9 C) (Oral)   Resp 18   SpO2 97%   Visual Acuity Right Eye Distance:   Left Eye Distance:   Bilateral Distance:    Right Eye Near:  Left Eye Near:    Bilateral Near:     Physical Exam Vitals reviewed.  Constitutional:      Appearance: Normal appearance.  Musculoskeletal:     Right knee: Effusion present. Tenderness present. No LCL laxity, MCL laxity, ACL laxity or PCL laxity.     Instability Tests: Anterior drawer test negative. Posterior drawer test negative.       Legs:  Skin:    General: Skin is warm and dry.  Neurological:     General: No focal deficit present.     Mental Status: He is alert and oriented to person, place, and time.  Psychiatric:        Mood and Affect: Mood normal.        Behavior: Behavior normal.      UC Treatments / Results  Labs (all labs ordered are listed, but only abnormal results are displayed) Labs Reviewed - No data to display  EKG   Radiology No results found.  Procedures Procedures (including critical care time)  Medications Ordered in UC Medications - No data to display  Initial Impression / Assessment and Plan / UC Course  I have reviewed the triage vital signs and the nursing notes.  Pertinent labs & imaging  results that were available during my care of the patient were reviewed by me and considered in my medical decision making (see chart for details).   Gabriel Camacho is a 67 y.o. male presenting with R knee pain and swelling. Patient is afebrile without recent antipyretics, satting well on room air. Overall is well appearing and non-toxic, well hydrated, without respiratory distress.  Erythema and edema over the right patella.  Somewhat fluctuant and warm.  Referred patient to orthopedic provider for urgent evaluation of possible effusion versus infection.  Counseled patient on potential for adverse effects with medications prescribed/recommended today, ER and return-to-clinic precautions discussed, patient verbalized understanding and agreement with care plan.   Final Clinical Impressions(s) / UC Diagnoses   Final diagnoses:  None   Discharge Instructions   None    ED Prescriptions   None    PDMP not reviewed this encounter.   Gabriel Camacho, [place] 02/04/23 1235

## 2023-02-09 DIAGNOSIS — L03115 Cellulitis of right lower limb: Secondary | ICD-10-CM | POA: Diagnosis not present

## 2023-02-11 ENCOUNTER — Telehealth: Payer: Self-pay | Admitting: Family Medicine

## 2023-02-11 NOTE — Telephone Encounter (Signed)
Contacted Gabriel Camacho to schedule their annual wellness visit. Appointment made for 02/15/2023.  Thank you,  [facility] Support [facility] Direct dial  [phone]

## 2023-02-14 DIAGNOSIS — M25469 Effusion, unspecified knee: Secondary | ICD-10-CM | POA: Diagnosis not present

## 2023-02-14 DIAGNOSIS — L03115 Cellulitis of right lower limb: Secondary | ICD-10-CM | POA: Diagnosis not present

## 2023-02-15 ENCOUNTER — Ambulatory Visit: Payer: Medicare HMO | Admitting: Family Medicine

## 2023-02-15 ENCOUNTER — Ambulatory Visit (INDEPENDENT_AMBULATORY_CARE_PROVIDER_SITE_OTHER): Payer: Medicare HMO

## 2023-02-15 VITALS — Wt 203.0 lb

## 2023-02-15 DIAGNOSIS — Z0000 Encounter for general adult medical examination without abnormal findings: Secondary | ICD-10-CM | POA: Diagnosis not present

## 2023-02-15 NOTE — Patient Instructions (Signed)
[name] , Thank you for taking time to come for your Medicare Wellness Visit. I appreciate your ongoing commitment to your health goals. Please review the following plan we discussed and let me know if I can assist you in the future.   These are the goals we discussed:  Goals      Maintain healthy lifetsyle     Stay active Healthy diet     Patient Stated     Keep living         This is a list of the screening recommended for you and due dates:  Health Maintenance  Topic Date Due   COVID-19 Vaccine (5 - 2023-24 season) 06/11/2022   Flu Shot  05/12/2023   Medicare Annual Wellness Visit  02/15/2024   DTaP/Tdap/Td vaccine (2 - Td or Tdap) 11/29/2027   Colon Cancer Screening  11/23/2032   Pneumonia Vaccine  Completed   Hepatitis C Screening: USPSTF Recommendation to screen - Ages 67-79 yo.  Completed   Zoster (Shingles) Vaccine  Completed   HPV Vaccine  Aged Out   Cologuard (Stool DNA test)  Discontinued    Advanced directives: Please bring a copy of your health care power of attorney and living will to the office to be added to your chart at your convenience.   Conditions/risks identified: Aim for 30 minutes of exercise or brisk walking, 6-8 glasses of water, and 5 servings of fruits and vegetables each day. Acute Knee Pain, Adult Acute knee pain is sudden and may be caused by damage, swelling, or irritation of the muscles and tissues that support the knee. Pain may result from: A fall. An injury to the knee from twisting motions. A hit to the knee. Infection. Acute knee pain may go away on its own with time and rest. If it does not, your health care provider may order tests to find the cause of the pain. These may include: Imaging tests, such as an X-ray, MRI, CT scan, or ultrasound. Joint aspiration. In this test, fluid is removed from the knee and evaluated. Arthroscopy. In this test, a lighted tube is inserted into the knee and an image is projected onto a TV  screen. Biopsy. In this test, a sample of tissue is removed from the body and studied under a microscope. Follow these instructions at home: If you have a knee sleeve or brace:  Wear the knee sleeve or brace as told by your health care provider. Remove it only as told by your health care provider. Loosen it if your toes tingle, become numb, or turn cold and blue. Keep it clean. If the knee sleeve or brace is not waterproof: Do not let it get wet. Cover it with a watertight covering when you take a bath or shower. Activity Rest your knee. Do not do things that cause pain or make pain worse. Avoid high-impact activities or exercises, such as running, jumping rope, or doing jumping jacks. Work with a physical therapist to make a safe exercise program, as recommended by your health care provider. Do exercises as told by your physical therapist. Managing pain, stiffness, and swelling  If directed, put ice on the affected knee. To do this: If you have a removable knee sleeve or brace, remove it as told by your health care provider. Put ice in a plastic bag. Place a towel between your skin and the bag. Leave the ice on for 20 minutes, 2-3 times a day. Remove the ice if your skin turns bright red.  This is very important. If you cannot feel pain, heat, or cold, you have a greater risk of damage to the area. If directed, use an elastic bandage to put pressure (compression) on your injured knee. This may control swelling, give support, and help with discomfort. Raise (elevate) your knee above the level of your heart while you are sitting or lying down. Sleep with a pillow under your knee. General instructions Take over-the-counter and prescription medicines only as told by your health care provider. Do not use any products that contain nicotine or tobacco, such as cigarettes, e-cigarettes, and chewing tobacco. If you need help quitting, ask your health care provider. If you are overweight, work  with your health care provider and a dietitian to set a weight-loss goal that is healthy and reasonable for you. Extra weight can put pressure on your knee. Pay attention to any changes in your symptoms. Keep all follow-up visits. This is important. Contact a health care provider if: Your knee pain continues, changes, or gets worse. You have a fever along with knee pain. Your knee feels warm to the touch or is red. Your knee buckles or locks up. Get help right away if: Your knee swells, and the swelling becomes worse. You cannot move your knee. You have severe pain in your knee that cannot be managed with pain medicine. Summary Acute knee pain can be caused by a fall, an injury, an infection, or damage, swelling, or irritation of the tissues that support your knee. Your health care provider may perform tests to find out the cause of the pain. Pay attention to any changes in your symptoms. Relieve your pain with rest, medicines, light activity, and the use of ice. Get help right away if your knee swells, you cannot move your knee, or you have severe pain that cannot be managed with medicine. This information is not intended to replace advice given to you by your health care provider. Make sure you discuss any questions you have with your health care provider. Document Revised: 03/12/2020 Document Reviewed: 03/12/2020 Elsevier Patient Education  2023 [employer].   Next appointment: Follow up in one year for your annual wellness visit    Preventive Care 65 Years and Older, Male Preventive care refers to lifestyle choices and visits with your health care provider that can promote health and wellness. What does preventive care include? A yearly physical exam. This is also called an annual well check. Dental exams once or twice a year. Routine eye exams. Ask your health care provider how often you should have your eyes checked. Personal lifestyle choices, including: Daily care of your  teeth and gums. Regular physical activity. Eating a healthy diet. Avoiding tobacco and drug use. Limiting alcohol use. Practicing safe sex. Taking low-dose aspirin every day. Taking vitamin and mineral supplements as recommended by your health care provider. What happens during an annual well check? The services and screenings done by your health care provider during your annual well check will depend on your age, overall health, lifestyle risk factors, and family history of disease. Counseling  Your health care provider may ask you questions about your: Alcohol use. Tobacco use. Drug use. Emotional well-being. Home and relationship well-being. Sexual activity. Eating habits. History of falls. Memory and ability to understand (cognition). Work and work Astronomer. Reproductive health. Screening  You may have the following tests or measurements: Height, weight, and BMI. Blood pressure. Lipid and cholesterol levels. These may be checked every 5 years, or more frequently if  you are over 72 years old. Skin check. Lung cancer screening. You may have this screening every year starting at age 59 if you have a 30-pack-year history of smoking and currently smoke or have quit within the past 15 years. Fecal occult blood test (FOBT) of the stool. You may have this test every year starting at age 43. Flexible sigmoidoscopy or colonoscopy. You may have a sigmoidoscopy every 5 years or a colonoscopy every 10 years starting at age 81. Hepatitis C blood test. Hepatitis B blood test. Sexually transmitted disease (STD) testing. Diabetes screening. This is done by checking your blood sugar (glucose) after you have not eaten for a while (fasting). You may have this done every 1-3 years. Bone density scan. This is done to screen for osteoporosis. You may have this done starting at age 68. Mammogram. This may be done every 1-2 years. Talk to your health care provider about how often you should have  regular mammograms. Talk with your health care provider about your test results, treatment options, and if necessary, the need for more tests. Vaccines  Your health care provider may recommend certain vaccines, such as: Influenza vaccine. This is recommended every year. Tetanus, diphtheria, and acellular pertussis (Tdap, Td) vaccine. You may need a Td booster every 10 years. Zoster vaccine. You may need this after age 41. Pneumococcal 13-valent conjugate (PCV13) vaccine. One dose is recommended after age 40. Pneumococcal polysaccharide (PPSV23) vaccine. One dose is recommended after age 55. Talk to your health care provider about which screenings and vaccines you need and how often you need them. This information is not intended to replace advice given to you by your health care provider. Make sure you discuss any questions you have with your health care provider. Document Released: 10/24/2015 Document Revised: 06/16/2016 Document Reviewed: 07/29/2015 Elsevier Interactive Patient Education  2017 [employer].  Fall Prevention in the Home Falls can cause injuries. They can happen to people of all ages. There are many things you can do to make your home safe and to help prevent falls. What can I do on the outside of my home? Regularly fix the edges of walkways and driveways and fix any cracks. Remove anything that might make you trip as you walk through a door, such as a raised step or threshold. Trim any bushes or trees on the path to your home. Use bright outdoor lighting. Clear any walking paths of anything that might make someone trip, such as rocks or tools. Regularly check to see if handrails are loose or broken. Make sure that both sides of any steps have handrails. Any raised decks and porches should have guardrails on the edges. Have any leaves, snow, or ice cleared regularly. Use sand or salt on walking paths during winter. Clean up any spills in your garage right away. This  includes oil or grease spills. What can I do in the bathroom? Use night lights. Install grab bars by the toilet and in the tub and shower. Do not use towel bars as grab bars. Use non-skid mats or decals in the tub or shower. If you need to sit down in the shower, use a plastic, non-slip stool. Keep the floor dry. Clean up any water that spills on the floor as soon as it happens. Remove soap buildup in the tub or shower regularly. Attach bath mats securely with double-sided non-slip rug tape. Do not have throw rugs and other things on the floor that can make you trip. What can I do  in the bedroom? Use night lights. Make sure that you have a light by your bed that is easy to reach. Do not use any sheets or blankets that are too big for your bed. They should not hang down onto the floor. Have a firm chair that has side arms. You can use this for support while you get dressed. Do not have throw rugs and other things on the floor that can make you trip. What can I do in the kitchen? Clean up any spills right away. Avoid walking on wet floors. Keep items that you use a lot in easy-to-reach places. If you need to reach something above you, use a strong step stool that has a grab bar. Keep electrical cords out of the way. Do not use floor polish or wax that makes floors slippery. If you must use wax, use non-skid floor wax. Do not have throw rugs and other things on the floor that can make you trip. What can I do with my stairs? Do not leave any items on the stairs. Make sure that there are handrails on both sides of the stairs and use them. Fix handrails that are broken or loose. Make sure that handrails are as long as the stairways. Check any carpeting to make sure that it is firmly attached to the stairs. Fix any carpet that is loose or worn. Avoid having throw rugs at the top or bottom of the stairs. If you do have throw rugs, attach them to the floor with carpet tape. Make sure that you  have a light switch at the top of the stairs and the bottom of the stairs. If you do not have them, ask someone to add them for you. What else can I do to help prevent falls? Wear shoes that: Do not have high heels. Have rubber bottoms. Are comfortable and fit you well. Are closed at the toe. Do not wear sandals. If you use a stepladder: Make sure that it is fully opened. Do not climb a closed stepladder. Make sure that both sides of the stepladder are locked into place. Ask someone to hold it for you, if possible. Clearly mark and make sure that you can see: Any grab bars or handrails. First and last steps. Where the edge of each step is. Use tools that help you move around (mobility aids) if they are needed. These include: Canes. Walkers. Scooters. Crutches. Turn on the lights when you go into a dark area. Replace any light bulbs as soon as they burn out. Set up your furniture so you have a clear path. Avoid moving your furniture around. If any of your floors are uneven, fix them. If there are any pets around you, be aware of where they are. Review your medicines with your doctor. Some medicines can make you feel dizzy. This can increase your chance of falling. Ask your doctor what other things that you can do to help prevent falls. This information is not intended to replace advice given to you by your health care provider. Make sure you discuss any questions you have with your health care provider. Document Released: 07/24/2009 Document Revised: 03/04/2016 Document Reviewed: 11/01/2014 Elsevier Interactive Patient Education  2017 [employer].

## 2023-02-15 NOTE — Progress Notes (Signed)
Subjective:   Gabriel Camacho is a 67 y.o. male who presents for Medicare Annual/Subsequent preventive examination.  Review of Systems    I connected with  Gabriel Camacho on 02/15/23 by a audio enabled telemedicine application and verified that I am speaking with the correct person using two identifiers.  Patient Location: Home  Provider Location: Home Office  I discussed the limitations of evaluation and management by telemedicine. The patient expressed understanding and agreed to proceed.  Cardiac Risk Factors include: advanced age (>8men, >104 women);hypertension     Objective:    Today's Vitals   02/15/23 1429  Weight: 203 lb (92.1 kg)  PainSc: 3    Body mass index is 29.98 kg/m.     02/15/2023    2:40 PM 11/23/2022    6:59 AM 03/15/2022   12:39 PM  Advanced Directives  Does Patient Have a Medical Advance Directive? Yes No Yes  Type of Estate agent of [place];Living will  Healthcare Power of [place];Living will  Does patient want to make changes to medical advance directive?   No - Patient declined  Copy of Healthcare Power of Attorney in Chart? No - copy requested  No - copy requested    Current Medications (verified) Outpatient Encounter Medications as of 02/15/2023  Medication Sig   aspirin EC 81 MG tablet Take 81 mg by mouth daily. Swallow whole.   atorvastatin (LIPITOR) 40 MG tablet TAKE 1 TABLET BY MOUTH EVERY DAY   cephALEXin (KEFLEX) 500 MG capsule Take 500 mg by mouth 4 (four) times daily.   hydrochlorothiazide (HYDRODIURIL) 25 MG tablet Take 0.5 tablets (12.5 mg total) by mouth daily.   lisinopril (ZESTRIL) 30 MG tablet Take 1 tablet (30 mg total) by mouth daily.   No facility-administered encounter medications on file as of 02/15/2023.    Allergies (verified) Patient has no known allergies.   History: Past Medical History:  Diagnosis Date   Allergy    Chicken pox    Past Surgical History:  Procedure Laterality Date    COLONOSCOPY WITH PROPOFOL N/A 11/23/2022   Procedure: COLONOSCOPY WITH PROPOFOL;  Surgeon: Midge Minium, MD;  Location: [facility] ENDOSCOPY;  Service: Endoscopy;  Laterality: N/A;   WRIST SURGERY Left    History reviewed. No pertinent family history. Social History   Socioeconomic History   Marital status: Married    Spouse name: Not on file   Number of children: Not on file   Years of education: Not on file   Highest education level: Not on file  Occupational History   Not on file  Tobacco Use   Smoking status: Former    Packs/day: .25    Types: Cigarettes   Smokeless tobacco: Never   Tobacco comments:    smokes 2 packs a week. 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  Building services engineer Use: Never used  Substance and Sexual Activity   Alcohol use: No    Alcohol/week: 0.0 standard drinks of alcohol   Drug use: No   Sexual activity: Not on file  Other Topics Concern   Not on file  Social History Narrative   Not on file   Social Determinants of Health   Financial Resource Strain: Low Risk  (02/15/2023)   Overall Financial Resource Strain (CARDIA)    Difficulty of Paying Living Expenses: Not hard at all  Food Insecurity: No Food Insecurity (02/15/2023)   Hunger Vital Sign    Worried About Running Out of Food in the  Last Year: Never true    Ran Out of Food in the Last Year: Never true  Transportation Needs: No Transportation Needs (02/15/2023)   PRAPARE - Administrator, Civil Service (Medical): No    Lack of Transportation (Non-Medical): No  Physical Activity: Sufficiently Active (02/15/2023)   Exercise Vital Sign    Days of Exercise per Week: 7 days    Minutes of Exercise per Session: 30 min  Stress: No Stress Concern Present (02/15/2023)   [employer] of Occupational Health - Occupational Stress Questionnaire    Feeling of Stress : Not at all  Social Connections: Socially Integrated (02/15/2023)   Social Connection and Isolation Panel [NHANES]    Frequency of  Communication with Friends and Family: More than three times a week    Frequency of Social Gatherings with Friends and Family: More than three times a week    Attends Religious Services: More than 4 times per year    Active Member of [employer] or Organizations: Yes    Attends Engineer, structural: More than 4 times per year    Marital Status: Married    Tobacco Counseling Counseling given: Yes Tobacco comments: smokes 2 packs a week. 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   Clinical Intake:  Pre-visit preparation completed: Yes  Pain : 0-10 Pain Score: 3  Pain Type: Acute pain Pain Location: Knee Pain Orientation: Right Pain Descriptors / Indicators: Aching Pain Onset: 1 to 4 weeks ago Pain Frequency: Several days a week     BMI - recorded: 29.98 Nutritional Status: BMI 25 -29 Overweight Nutritional Risks: None Diabetes: No  How often do you need to have someone help you when you read instructions, pamphlets, or other written materials from your doctor or pharmacy?: 1 - Never  Diabetic?NO  Interpreter Needed?: No  Information entered by :: Fredirick Maudlin   Activities of Daily Living    02/15/2023    2:42 PM 03/15/2022   12:44 PM  In your present state of health, do you have any difficulty performing the following activities:  Hearing? 0 0  Vision? 0 0  Difficulty concentrating or making decisions? 0 0  Walking or climbing stairs? 0 0  Dressing or bathing? 0 0  Doing errands, shopping? 0 0  Preparing Food and eating ? N N  Using the Toilet? N N  In the past six months, have you accidently leaked urine? N N  Do you have problems with loss of bowel control? N N  Managing your Medications? N N  Managing your Finances? N N  Housekeeping or managing your Housekeeping? N N    Patient Care Team: Glori Luis, MD as PCP - General (Family Medicine)  Indicate any recent Medical Services you may have received from other than [facility] providers in the past year  (date may be approximate).     Assessment:   This is a routine wellness examination for Gabriel Camacho.  Hearing/Vision screen Hearing Screening - Comments:: Denies hearing difficulties   Vision Screening - Comments:: Wears rx glasses - up to date with routine eye exams with  [facility]   Dietary issues and exercise activities discussed: Current Exercise Habits: Home exercise routine, Type of exercise: walking, Time (Minutes): 45, Frequency (Times/Week): 7, Weekly Exercise (Minutes/Week): 315, Intensity: Mild   Goals Addressed             This Visit's Progress    Maintain healthy lifetsyle   On track    Stay active  Healthy diet     Patient Stated       Keep living       Depression Screen    02/15/2023    2:42 PM 11/15/2022    8:12 AM 03/15/2022   12:44 PM 11/11/2021    8:06 AM 05/11/2021    9:06 AM 11/12/2019    3:24 PM 09/04/2018    3:39 PM  PHQ 2/9 Scores  PHQ - 2 Score 0 0 0 0 0 0 0    Fall Risk    02/15/2023    2:41 PM 11/15/2022    8:12 AM 03/15/2022   12:45 PM 05/11/2021    9:05 AM 10/17/2020    2:37 PM  Fall Risk   Falls in the past year? 0 0 0 0 0  Number falls in past yr: 0 0 0 0 0  Injury with Fall? 0 0   0  Risk for fall due to : No Fall Risks No Fall Risks     Follow up Falls prevention discussed;Falls evaluation completed Falls evaluation completed Falls evaluation completed Falls evaluation completed Falls evaluation completed    FALL RISK PREVENTION PERTAINING TO THE HOME:  Any stairs in or around the home? No  If so, are there any without handrails? No  Home free of loose throw rugs in walkways, pet beds, electrical cords, etc? No  Adequate lighting in your home to reduce risk of falls? No   ASSISTIVE DEVICES UTILIZED TO PREVENT FALLS:  Life alert? No  Use of a cane, walker or w/c? No  Grab bars in the bathroom? No  Shower chair or bench in shower? No  Elevated toilet seat or a handicapped toilet? No   TIMED UP AND GO:  Was the test performed? No .  Televisit        02/15/2023    2:37 PM  6CIT Screen  What Year? 0 points  What month? 0 points  What time? 0 points  Count back from 20 0 points  Months in reverse 0 points  Repeat phrase 0 points  Total Score 0 points    Immunizations Immunization History  Administered Date(s) Administered   Fluad Quad(high Dose 65+) 11/15/2022   Influenza Inj Mdck Quad Pf 10/10/2017   Influenza, High Dose Seasonal PF 07/27/2021   Influenza,inj,Quad PF,6+ Mos 09/05/2018, 09/09/2019   Influenza-Unspecified 10/10/2017, 09/09/2019, 08/11/2020   PFIZER(Purple Top)SARS-COV-2 Vaccination 01/10/2020, 02/21/2020, 09/02/2020   Pfizer Covid-19 Vaccine Bivalent Booster 6yrs & up 07/29/2021   Pneumococcal Conjugate-13 11/10/2020   Pneumococcal Polysaccharide-23 03/09/2016, 11/11/2021   Tdap 11/28/2017   Zoster Recombinat (Shingrix) 11/10/2020, 02/05/2021    TDAP status: Up to date  Flu Vaccine status: Up to date  Pneumococcal vaccine status: Up to date  Covid-19 vaccine status: Information provided on how to obtain vaccines.   Qualifies for Shingles Vaccine? Yes   Zostavax completed Yes   Shingrix Completed?: Yes  Screening Tests Health Maintenance  Topic Date Due   COVID-19 Vaccine (5 - 2023-24 season) 06/11/2022   INFLUENZA VACCINE  05/12/2023   Medicare Annual Wellness (AWV)  02/15/2024   DTaP/Tdap/Td (2 - Td or Tdap) 11/29/2027   COLONOSCOPY (Pts 45-1yrs Insurance coverage will need to be confirmed)  11/23/2032   Pneumonia Vaccine 76+ Years old  Completed   Hepatitis C Screening  Completed   Zoster Vaccines- Shingrix  Completed   HPV VACCINES  Aged Out   Fecal DNA (Cologuard)  Discontinued    Health Maintenance  Health Maintenance Due  Topic Date Due   COVID-19 Vaccine (5 - 2023-24 season) 06/11/2022    Colorectal cancer screening: Type of screening: Colonoscopy. Completed 11/15/2022. Repeat every 10 years  Lung Cancer Screening: (Low Dose CT Chest recommended if Age 38-80  years, 30 pack-year currently smoking OR have quit w/in 15years.) does not qualify.     Additional Screening:  Hepatitis C Screening: does qualify; Completed 11/29/2017  Vision Screening: Recommended annual ophthalmology exams for early detection of glaucoma and other disorders of the eye. Is the patient up to date with their annual eye exam?  Yes  Who is the provider or what is the name of the office in which the patient attends annual eye exams?[facility]  If pt is not established with a provider, would they like to be referred to a provider to establish care? No .   Dental Screening: Recommended annual dental exams for proper oral hygiene  Community Resource Referral / Chronic Care Management: CRR required this visit?  No   CCM required this visit?  No      Plan:     I have personally reviewed and noted the following in the patient's chart:   Medical and social history Use of alcohol, tobacco or illicit drugs  Current medications and supplements including opioid prescriptions. Patient is not currently taking opioid prescriptions. Functional ability and status Nutritional status Physical activity Advanced directives List of other physicians Hospitalizations, surgeries, and ER visits in previous 12 months Vitals Screenings to include cognitive, depression, and falls Referrals and appointments  In addition, I have reviewed and discussed with patient certain preventive protocols, quality metrics, and best practice recommendations. A written personalized care plan for preventive services as well as general preventive health recommendations were provided to patient.     Annabell Sabal, CMA   02/15/2023   Nurse Notes: None

## 2023-02-17 ENCOUNTER — Ambulatory Visit (INDEPENDENT_AMBULATORY_CARE_PROVIDER_SITE_OTHER): Payer: Medicare HMO | Admitting: Family Medicine

## 2023-02-17 ENCOUNTER — Encounter: Payer: Self-pay | Admitting: Family Medicine

## 2023-02-17 ENCOUNTER — Telehealth: Payer: Self-pay | Admitting: Family Medicine

## 2023-02-17 VITALS — BP 120/78 | HR 82 | Temp 97.6°F | Ht 69.0 in | Wt 202.0 lb

## 2023-02-17 DIAGNOSIS — M7041 Prepatellar bursitis, right knee: Secondary | ICD-10-CM

## 2023-02-17 DIAGNOSIS — R7303 Prediabetes: Secondary | ICD-10-CM

## 2023-02-17 DIAGNOSIS — I10 Essential (primary) hypertension: Secondary | ICD-10-CM | POA: Diagnosis not present

## 2023-02-17 DIAGNOSIS — E785 Hyperlipidemia, unspecified: Secondary | ICD-10-CM

## 2023-02-17 DIAGNOSIS — M719 Bursopathy, unspecified: Secondary | ICD-10-CM | POA: Insufficient documentation

## 2023-02-17 MED ORDER — HYDROCHLOROTHIAZIDE 12.5 MG PO TABS: 12.5000 mg | ORAL_TABLET | Freq: Every day | ORAL | 3 refills | Status: DC

## 2023-02-17 NOTE — Assessment & Plan Note (Signed)
Patient with likely bursitis that may have been infected though it seems to have been adequately treated with Keflex.  Discussed that orthopedics may want to drain this when he sees them next week.  He will monitor and finish his antibiotics.

## 2023-02-17 NOTE — Telephone Encounter (Signed)
Please call the patient. I realized after he left that he needed to have a BMET done given that we increased his lisinopril at his last visit. This is to make sure his kidneys and potassium tolerated the dose increase of the lisinopril. I tried to catch him before he left though he had already gone through check out. I placed future orders for the BMET. He can be scheduled for labs at his convenience in the next couple of weeks.

## 2023-02-17 NOTE — Telephone Encounter (Signed)
Left message to call the office back regarding Dr. Sonnenberg's message 

## 2023-02-17 NOTE — Assessment & Plan Note (Signed)
Chronic issue.  Blood pressure much improved.  Discussed monitoring his lightheadedness and if it continues to be an issue he can let me know through MyChart for dose adjustment of his medication.  For now he will continue lisinopril 30 mg daily and HCTZ 12.5 mg daily.  I noted after the patient left the office that he needed to have a BMP done.  I tried to catch him before he left though he was already gone.  I will have somebody contact him to get this scheduled.

## 2023-02-17 NOTE — Progress Notes (Signed)
Marikay Alar, MD Phone: [phone]  Gabriel Camacho is a 67 y.o. male who presents today for f/u.  HYPERTENSION Disease Monitoring Home BP Monitoring similar up to 130 systolic Chest pain- no    Dyspnea- no Medications Compliance-  taking lisinopril, HCTZ. Lightheadedness-  2x this week when standing up though previously had done well  Edema- no BMET    Component Value Date/Time   NA 138 11/22/2022 1113   K 4.7 11/22/2022 1113   CL 101 11/22/2022 1113   CO2 29 11/22/2022 1113   GLUCOSE 106 (H) 11/22/2022 1113   BUN 27 (H) 11/22/2022 1113   CREATININE 1.27 11/22/2022 1113   CREATININE 1.27 (H) 10/17/2020 1523   CALCIUM 9.7 11/22/2022 1113   Right knee injury: Patient notes he stained his deck and was kneeling down and was fine though after he took a nap he woke up and could not move his knee.  He had swelling as well as erythema.  He was sent to orthopedics and has been placed on Keflex.  He notes that it has improved.  He follows up with the orthopedic physician next week for further evaluation given that the swelling has persisted.  Social History   Tobacco Use  Smoking Status Former   Packs/day: .25   Types: Cigarettes  Smokeless Tobacco Never  Tobacco Comments   smokes 2 packs a week. 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 Swallow whole.     atorvastatin (LIPITOR) 40 MG tablet TAKE 1 TABLET BY MOUTH EVERY DAY 90 tablet 3   cephALEXin (KEFLEX) 500 MG capsule Take 500 mg by mouth 4 (four) times daily.     lisinopril (ZESTRIL) 30 MG tablet Take 1 tablet (30 mg total) by mouth daily. 90 tablet 2   No current facility-administered medications on file prior to visit.     ROS see history of present illness  Objective  Physical Exam Vitals:   02/17/23 0831  BP: 120/78  Pulse: 82  Temp: 97.6 F (36.4 C)  SpO2: 99%    BP  Readings from Last 3 Encounters:  02/17/23 120/78  02/04/23 (!) 165/90  11/23/22 (!) 129/90   Wt Readings from Last 3 Encounters:  02/17/23 202 lb (91.6 kg)  02/15/23 203 lb (92.1 kg)  11/23/22 202 lb (91.6 kg)    Physical Exam Constitutional:      General: He is not in acute distress.    Appearance: He is not diaphoretic.  Cardiovascular:     Rate and Rhythm: Normal rate and regular rhythm.     Heart sounds: Normal heart sounds.  Pulmonary:     Effort: Pulmonary effort is normal.     Breath sounds: Normal breath sounds.  Musculoskeletal:       Legs:  Skin:    General: Skin is warm and dry.  Neurological:     Mental Status: He is alert.      Assessment/Plan: Please see individual problem list.  Primary hypertension Assessment & Plan: Chronic issue.  Blood pressure much improved.  Discussed monitoring his lightheadedness and if it continues to be an issue he can let me know through MyChart for dose adjustment of his medication.  For now he will continue lisinopril 30 mg daily and HCTZ 12.5 mg daily.  I noted after the patient left the office that he needed  to have a BMP done.  I tried to catch him before he left though he was already gone.  I will have somebody contact him to get this scheduled.  Orders: -     Basic metabolic panel; Future -     hydroCHLOROthiazide; Take 1 tablet (12.5 mg total) by mouth daily.  Dispense: 90 tablet; Refill: 3  Hyperlipidemia, unspecified hyperlipidemia type  Prediabetes  Prepatellar bursitis of right knee Assessment & Plan: Patient with likely bursitis that may have been infected though it seems to have been adequately treated with Keflex.  Discussed that orthopedics may want to drain this when he sees them next week.  He will monitor and finish his antibiotics.      Return in about 3 months (around 05/20/2023) for htn.   Marikay Alar, MD [facility] Primary [facility]

## 2023-02-18 NOTE — Telephone Encounter (Signed)
Left a detailed vm to get pt scheduled for a lab appt for BMET also sending pt a mychart msg.  When pt calls back pt needs a lab appt at his convenience

## 2023-03-03 ENCOUNTER — Other Ambulatory Visit (INDEPENDENT_AMBULATORY_CARE_PROVIDER_SITE_OTHER): Payer: Medicare HMO

## 2023-03-03 DIAGNOSIS — I10 Essential (primary) hypertension: Secondary | ICD-10-CM | POA: Diagnosis not present

## 2023-03-03 LAB — BASIC METABOLIC PANEL
BUN: 25 mg/dL — ABNORMAL HIGH (ref 6–23)
CO2: 27 mEq/L (ref 19–32)
Calcium: 8.8 mg/dL (ref 8.4–10.5)
Chloride: 104 mEq/L (ref 96–112)
Creatinine, Ser: 1.16 mg/dL (ref 0.40–1.50)
GFR: 65.26 mL/min (ref >60.00)
Glucose, Bld: 110 mg/dL — ABNORMAL HIGH (ref 70–99)
Potassium: 4.2 mEq/L (ref 3.5–5.1)
Sodium: 136 mEq/L (ref 135–145)

## 2023-03-09 DIAGNOSIS — M7041 Prepatellar bursitis, right knee: Secondary | ICD-10-CM | POA: Diagnosis not present

## 2023-04-06 DIAGNOSIS — L920 Granuloma annulare: Secondary | ICD-10-CM | POA: Diagnosis not present

## 2023-04-06 DIAGNOSIS — L309 Dermatitis, unspecified: Secondary | ICD-10-CM | POA: Diagnosis not present

## 2023-04-10 ENCOUNTER — Other Ambulatory Visit: Payer: Self-pay | Admitting: Gastroenterology

## 2023-05-11 ENCOUNTER — Encounter (INDEPENDENT_AMBULATORY_CARE_PROVIDER_SITE_OTHER): Payer: Self-pay

## 2023-05-25 ENCOUNTER — Ambulatory Visit (INDEPENDENT_AMBULATORY_CARE_PROVIDER_SITE_OTHER): Payer: Medicare HMO | Admitting: Family Medicine

## 2023-05-25 ENCOUNTER — Encounter: Payer: Self-pay | Admitting: Family Medicine

## 2023-05-25 VITALS — BP 124/76 | HR 78 | Temp 97.5°F | Ht 69.0 in | Wt 201.8 lb

## 2023-05-25 DIAGNOSIS — E785 Hyperlipidemia, unspecified: Secondary | ICD-10-CM

## 2023-05-25 DIAGNOSIS — R7303 Prediabetes: Secondary | ICD-10-CM | POA: Diagnosis not present

## 2023-05-25 DIAGNOSIS — I10 Essential (primary) hypertension: Secondary | ICD-10-CM | POA: Diagnosis not present

## 2023-05-25 MED ORDER — LISINOPRIL 30 MG PO TABS: 30.0000 mg | ORAL_TABLET | Freq: Every day | ORAL | 2 refills | Status: DC

## 2023-05-25 NOTE — Progress Notes (Addendum)
  Marikay Alar, MD Phone: [phone]  Gabriel Camacho is a 67 y.o. male who presents today for f/u.  Hypertension: Patient notes his blood pressure running similar to today.  He is taking lisinopril 30 mg daily and HCTZ 12.5 mg daily.  No chest pain or shortness of breath.  No edema.  Has rare lightheadedness.  He drinks plenty of sugar-free sports drinks.  Has water with meals.  Prediabetes: No polyuria or polydipsia.  He notes he eats things in moderation and does not eat to excess.  He walks for exercise and does some light weightlifting.  He also plays golf.  He does something 5 to 7 days a week.  Social History   Tobacco Use  Smoking Status Former   Current packs/day: 0.25   Types: Cigarettes  Smokeless Tobacco Never  Tobacco Comments   smokes 2 packs a week. 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 Swallow whole.     atorvastatin (LIPITOR) 40 MG tablet TAKE 1 TABLET BY MOUTH EVERY DAY 90 tablet 3   hydrochlorothiazide (HYDRODIURIL) 12.5 MG tablet Take 1 tablet (12.5 mg total) by mouth daily. 90 tablet 3   No current facility-administered medications on file prior to visit.     ROS see history of present illness  Objective  Physical Exam Vitals:   05/25/23 0805  BP: 124/76  Pulse: 78  Temp: (!) 97.5 F (36.4 C)  SpO2: 98%    BP Readings from Last 3 Encounters:  05/25/23 124/76  02/17/23 120/78  02/04/23 (!) 165/90   Wt Readings from Last 3 Encounters:  05/25/23 201 lb 12.8 oz (91.5 kg)  02/17/23 202 lb (91.6 kg)  02/15/23 203 lb (92.1 kg)    Physical Exam Constitutional:      General: He is not in acute distress.    Appearance: He is not diaphoretic.  Cardiovascular:     Rate and Rhythm: Normal rate and regular rhythm.     Heart sounds: Normal heart sounds.  Pulmonary:     Effort: Pulmonary effort is normal.      Breath sounds: Normal breath sounds.  Skin:    General: Skin is warm and dry.  Neurological:     Mental Status: He is alert.      Assessment/Plan: Please see individual problem list.  Primary hypertension Assessment & Plan: Chronic issue.  Adequately controlled.  Patient will continue lisinopril 30 mg daily and HCTZ 12.5 mg daily.  Follow-up in 6 months.  Orders: -     Lisinopril; Take 1 tablet (30 mg total) by mouth daily.  Dispense: 90 tablet; Refill: 2  Prediabetes Assessment & Plan: Chronic issue.  Encouraged continued moderation in his diet and continuing with exercise.      Return in about 6 months (around 11/25/2023).   Marikay Alar, MD [facility] Primary Care [facility]

## 2023-05-25 NOTE — Assessment & Plan Note (Signed)
Chronic issue.  Encouraged continued moderation in his diet and continuing with exercise.

## 2023-05-25 NOTE — Assessment & Plan Note (Signed)
Chronic issue.  Adequately controlled.  Patient will continue lisinopril 30 mg daily and HCTZ 12.5 mg daily.  Follow-up in 6 months.

## 2023-05-25 NOTE — Addendum Note (Signed)
Addended by: Glori Luis on: 05/25/2023 08:14 AM   Modules accepted: Orders

## 2023-06-11 ENCOUNTER — Telehealth: Payer: Self-pay | Admitting: Family Medicine

## 2023-06-11 DIAGNOSIS — I10 Essential (primary) hypertension: Secondary | ICD-10-CM

## 2023-06-15 NOTE — Telephone Encounter (Signed)
The patient should be on lisinopril 30 mg daily.  This was refilled on August 14.  The dosing of this current refill request is not correct and should be refused.  Please make sure he has lisinopril 30 mg daily at home.  Thanks.

## 2023-06-15 NOTE — Telephone Encounter (Signed)
Attempted to call the Patient-no answer and voicemail is full

## 2023-06-15 NOTE — Telephone Encounter (Signed)
Patient just called back. He said he is still taking Lisinopril.

## 2023-06-15 NOTE — Telephone Encounter (Signed)
LMTCB. Need to find out if pt is still taking Lisinopril or not.

## 2023-06-15 NOTE — Telephone Encounter (Signed)
Patient states he is on the Lisinopril 30 mg daily and he does have it.

## 2023-07-20 DIAGNOSIS — L821 Other seborrheic keratosis: Secondary | ICD-10-CM | POA: Diagnosis not present

## 2023-07-20 DIAGNOSIS — Z08 Encounter for follow-up examination after completed treatment for malignant neoplasm: Secondary | ICD-10-CM | POA: Diagnosis not present

## 2023-07-20 DIAGNOSIS — Z85828 Personal history of other malignant neoplasm of skin: Secondary | ICD-10-CM | POA: Diagnosis not present

## 2023-07-20 DIAGNOSIS — D2262 Melanocytic nevi of left upper limb, including shoulder: Secondary | ICD-10-CM | POA: Diagnosis not present

## 2023-07-20 DIAGNOSIS — D485 Neoplasm of uncertain behavior of skin: Secondary | ICD-10-CM | POA: Diagnosis not present

## 2023-07-20 DIAGNOSIS — D2272 Melanocytic nevi of left lower limb, including hip: Secondary | ICD-10-CM | POA: Diagnosis not present

## 2023-07-20 DIAGNOSIS — D225 Melanocytic nevi of trunk: Secondary | ICD-10-CM | POA: Diagnosis not present

## 2023-07-20 DIAGNOSIS — L280 Lichen simplex chronicus: Secondary | ICD-10-CM | POA: Diagnosis not present

## 2023-07-20 DIAGNOSIS — D2261 Melanocytic nevi of right upper limb, including shoulder: Secondary | ICD-10-CM | POA: Diagnosis not present

## 2023-07-20 DIAGNOSIS — D2271 Melanocytic nevi of right lower limb, including hip: Secondary | ICD-10-CM | POA: Diagnosis not present

## 2023-08-25 DIAGNOSIS — Z0100 Encounter for examination of eyes and vision without abnormal findings: Secondary | ICD-10-CM | POA: Diagnosis not present

## 2023-08-25 DIAGNOSIS — H524 Presbyopia: Secondary | ICD-10-CM | POA: Diagnosis not present

## 2023-10-17 DIAGNOSIS — H902 Conductive hearing loss, unspecified: Secondary | ICD-10-CM | POA: Diagnosis not present

## 2023-10-17 DIAGNOSIS — H6123 Impacted cerumen, bilateral: Secondary | ICD-10-CM | POA: Diagnosis not present

## 2023-11-25 ENCOUNTER — Ambulatory Visit: Payer: Medicare HMO | Admitting: Family Medicine

## 2023-11-29 ENCOUNTER — Telehealth: Payer: Self-pay

## 2023-11-29 NOTE — Telephone Encounter (Signed)
Copied from CRM [id number]. Topic: Appointments - Transfer of Care >> Nov 29, 2023  9:54 AM [name] wrote: Pt is requesting to transfer FROM: Gabriel Camacho Pt is requesting to transfer TO: Gabriel Camacho Reason for requested transfer: Provider left facility. It is the responsibility of the team the patient would like to transfer to (Dr. Evelene Croon) to reach out to the patient if for any reason this transfer is not acceptable.

## 2023-12-02 NOTE — Telephone Encounter (Signed)
It is fine with me

## 2024-01-02 ENCOUNTER — Other Ambulatory Visit: Payer: Self-pay

## 2024-01-02 DIAGNOSIS — I10 Essential (primary) hypertension: Secondary | ICD-10-CM

## 2024-01-02 MED ORDER — HYDROCHLOROTHIAZIDE 12.5 MG PO TABS: 12.5000 mg | ORAL_TABLET | Freq: Every day | ORAL | 0 refills | Status: DC

## 2024-01-18 ENCOUNTER — Other Ambulatory Visit: Payer: Self-pay | Admitting: Family Medicine

## 2024-01-18 MED ORDER — ATORVASTATIN CALCIUM 40 MG PO TABS: 40.0000 mg | ORAL_TABLET | Freq: Every day | ORAL | 0 refills | Status: DC

## 2024-01-18 NOTE — Telephone Encounter (Signed)
 Copied from CRM [id number]. Topic: Clinical - Medication Refill >> Jan 18, 2024  8:30 AM Drema Balzarine wrote: Most Recent Primary Care Visit:  Provider: Birdie Sons ERIC G  Department: [facility]  Visit Type: OFFICE VISIT  Date: 05/25/2023  Medication: Atorvastatin   Has the patient contacted their pharmacy? Yes (Agent: If no, request that the patient contact the pharmacy for the refill. If patient does not wish to contact the pharmacy document the reason why and proceed with request.) (Agent: If yes, when and what did the pharmacy advise?)  Is this the correct pharmacy for this prescription? Yes If no, delete pharmacy and type the correct one.  This is the patient's preferred pharmacy:   [id number] - [address] [place] [address] Phone: [phone] Fax: [phone]   Has the prescription been filled recently? Yes  Is the patient out of the medication? No  Has the patient been seen for an appointment in the last year OR does the patient have an upcoming appointment? Yes  Can we respond through MyChart? Yes  Agent: Please be advised that Rx refills may take up to 3 business days. We ask that you follow-up with your pharmacy.

## 2024-01-25 ENCOUNTER — Ambulatory Visit: Payer: Self-pay

## 2024-01-25 NOTE — Telephone Encounter (Signed)
 Patient called in and was assisted by agent with making a transfer of care appointment to a new physician since his previous physician is no longer with us . Patient states he has been experiencing mild, intermittent dizziness when bending over and assumed it was related to his BP. Patient has been keeping a BP log and his BP is running in 110's/60's. Patient reports 1 fainting spell last week [place] and denies knowing if he hit his head. Patient denies any headaches or injuries. This RN encouraged patient to hydrate with water and electrolytes due to patient being on hydrochlorothiazide for his BP. Patient encouraged to call back if symptoms worsen. Patient educated to go to ED if he has another fainting spell.  Copied from CRM [id number]. Topic: Clinical - Red Word Triage >> Jan 25, 2024 11:37 AM Alethia Huxley E wrote: [facility] that prompted transfer to Nurse Triage: Patient feels light headed and passed out last week Thursday 4/10. Patient states his blood pressure this morning was 116/60. Patient stated bending over is when he starts to feel light headed. Reason for Disposition  [1] Other NON-URGENT information for PCP AND [2] does not require PCP response  Answer Assessment - Initial Assessment Questions 1. REASON FOR CALL or QUESTION: "What is your reason for calling today?" or "How can I best help you?" or "What question do you have that I can help answer?"     Please see notes 2. CALLER: Document the source of call. (e.g., laboratory, patient).     Patient  Protocols used: PCP Call - No Triage-A-AH

## 2024-02-08 DIAGNOSIS — L570 Actinic keratosis: Secondary | ICD-10-CM | POA: Diagnosis not present

## 2024-02-08 DIAGNOSIS — Z85828 Personal history of other malignant neoplasm of skin: Secondary | ICD-10-CM | POA: Diagnosis not present

## 2024-02-08 DIAGNOSIS — D2262 Melanocytic nevi of left upper limb, including shoulder: Secondary | ICD-10-CM | POA: Diagnosis not present

## 2024-02-08 DIAGNOSIS — D2261 Melanocytic nevi of right upper limb, including shoulder: Secondary | ICD-10-CM | POA: Diagnosis not present

## 2024-02-08 DIAGNOSIS — D2272 Melanocytic nevi of left lower limb, including hip: Secondary | ICD-10-CM | POA: Diagnosis not present

## 2024-02-08 DIAGNOSIS — D225 Melanocytic nevi of trunk: Secondary | ICD-10-CM | POA: Diagnosis not present

## 2024-02-13 ENCOUNTER — Other Ambulatory Visit: Payer: Self-pay | Admitting: Internal Medicine

## 2024-02-16 ENCOUNTER — Encounter (HOSPITAL_COMMUNITY): Payer: Self-pay

## 2024-02-20 ENCOUNTER — Other Ambulatory Visit: Payer: Self-pay | Admitting: Family Medicine

## 2024-02-20 DIAGNOSIS — I10 Essential (primary) hypertension: Secondary | ICD-10-CM

## 2024-02-20 MED ORDER — LISINOPRIL 30 MG PO TABS: 30.0000 mg | ORAL_TABLET | Freq: Every day | ORAL | 0 refills | Status: DC

## 2024-02-20 NOTE — Telephone Encounter (Signed)
 Copied from CRM [id number]. Topic: Clinical - Medication Refill >> Feb 20, 2024 10:22 AM [name] wrote: Medication: lisinopril  (ZESTRIL ) 30 MG tablet  Has the patient contacted their pharmacy? Yes (Agent: If no, request that the patient contact the pharmacy for the refill. If patient does not wish to contact the pharmacy document the reason why and proceed with request.) (Agent: If yes, when and what did the pharmacy advise?) Patient was advised to follow-up with primary for a refill.  This is the patient's preferred pharmacy:  [id number] - [address] [place] [address] Phone: [phone] Fax: [phone]  Is this the correct pharmacy for this prescription? Yes If no, delete pharmacy and type the correct one.   Has the prescription been filled recently? No  Is the patient out of the medication? No  Has the patient been seen for an appointment in the last year OR does the patient have an upcoming appointment? Yes  Can we respond through MyChart? No  Agent: Please be advised that Rx refills may take up to 3 business days. We ask that you follow-up with your pharmacy.

## 2024-02-23 ENCOUNTER — Encounter: Payer: Self-pay | Admitting: Nurse Practitioner

## 2024-02-23 ENCOUNTER — Ambulatory Visit (INDEPENDENT_AMBULATORY_CARE_PROVIDER_SITE_OTHER): Payer: Medicare HMO | Admitting: Nurse Practitioner

## 2024-02-23 VITALS — BP 130/68 | HR 73 | Temp 97.7°F | Ht 69.0 in | Wt 202.0 lb

## 2024-02-23 DIAGNOSIS — E785 Hyperlipidemia, unspecified: Secondary | ICD-10-CM

## 2024-02-23 DIAGNOSIS — I10 Essential (primary) hypertension: Secondary | ICD-10-CM | POA: Diagnosis not present

## 2024-02-23 DIAGNOSIS — R7303 Prediabetes: Secondary | ICD-10-CM | POA: Diagnosis not present

## 2024-02-23 MED ORDER — ATORVASTATIN CALCIUM 40 MG PO TABS: 40.0000 mg | ORAL_TABLET | Freq: Every day | ORAL | 2 refills | Status: AC

## 2024-02-23 MED ORDER — LISINOPRIL 30 MG PO TABS: 30.0000 mg | ORAL_TABLET | Freq: Every day | ORAL | 2 refills | Status: AC

## 2024-02-23 MED ORDER — HYDROCHLOROTHIAZIDE 12.5 MG PO TABS: 12.5000 mg | ORAL_TABLET | Freq: Every day | ORAL | 2 refills | Status: AC

## 2024-02-23 NOTE — Progress Notes (Signed)
 Established Patient Office Visit  Subjective:  Patient ID: Gabriel Camacho, male    DOB: [birth year]  Age: 68 y.o. MRN: [id number]  CC:  Chief Complaint  Patient presents with   Transfer of care    HPI  Gabriel Camacho presents for Transfer of care. His previous PCP was Dr. Lovetta Rucks.   Hypertension: He is on  lisinopril  30 mg, hydrochlorothiazide  12.5 mg. monitors his blood pressure regularly, noting occasional increases after consuming red meat, but generally maintains good readings.  Hyperlipidemia: Managed by Lipitor 40 mg. He experiences infrequent episodes of lightheadedness, often triggered by bending over or standing up quickly.  Prediabetes: Diet controlled. He is retired, stays active with golf and pickleball.   He experiences mild hand pain, possibly due to arthritis, and uses a hand massager and squeezing ball for relief. He prefers to avoid medication for this issue  HPI   Past Medical History:  Diagnosis Date   Allergy    Chicken pox     Past Surgical History:  Procedure Laterality Date   COLONOSCOPY WITH PROPOFOL  N/A 11/23/2022   Procedure: COLONOSCOPY WITH PROPOFOL ;  Surgeon: Marnee Sink, MD;  Location: [facility] ENDOSCOPY;  Service: Endoscopy;  Laterality: N/A;   WRIST SURGERY Left     History reviewed. No pertinent family history.  Social History   Socioeconomic History   Marital status: Married    Spouse name: Not on file   Number of children: Not on file   Years of education: Not on file   Highest education level: Not on file  Occupational History   Not on file  Tobacco Use   Smoking status: Former    Current packs/day: 0.25    Types: Cigarettes   Smokeless tobacco: Never   Tobacco comments:    smokes 2 packs a week. 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   Vaping status: Never Used  Substance and Sexual Activity   Alcohol use: No    Alcohol/week: 0.0 standard drinks of alcohol   Drug use: No   Sexual activity: Not on file  Other  Topics Concern   Not on file  Social History Narrative   Not on file   Social Drivers of Health   Financial Resource Strain: Low Risk  (02/15/2023)   Overall Financial Resource Strain (CARDIA)    Difficulty of Paying Living Expenses: Not hard at all  Food Insecurity: No Food Insecurity (02/15/2023)   Hunger Vital Sign    Worried About Running Out of Food in the Last Year: Never true    Ran Out of Food in the Last Year: Never true  Transportation Needs: No Transportation Needs (02/15/2023)   PRAPARE - Administrator, Civil Service (Medical): No    Lack of Transportation (Non-Medical): No  Physical Activity: Sufficiently Active (02/15/2023)   Exercise Vital Sign    Days of Exercise per Week: 7 days    Minutes of Exercise per Session: 30 min  Stress: No Stress Concern Present (02/15/2023)   [employer] of Occupational Health - Occupational Stress Questionnaire    Feeling of Stress : Not at all  Social Connections: Socially Integrated (02/15/2023)   Social Connection and Isolation Panel [NHANES]    Frequency of Communication with Friends and Family: More than three times a week    Frequency of Social Gatherings with Friends and Family: More than three times a week    Attends Religious Services: More than 4 times per year  Active Member of Clubs or Organizations: Yes    Attends Banker Meetings: More than 4 times per year    Marital Status: Married  Catering manager Violence: Not At Risk (02/15/2023)   Humiliation, Afraid, Rape, and Kick questionnaire    Fear of Current or Ex-Partner: No    Emotionally Abused: No    Physically Abused: No    Sexually Abused: No     Outpatient Medications Prior to Visit  Medication Sig Dispense Refill   aspirin EC 81 MG tablet Take 81 mg by mouth daily. Swallow whole.     atorvastatin  (LIPITOR) 40 MG tablet Take 1 tablet (40 mg total) by mouth daily. 30 tablet 0   hydrochlorothiazide  (HYDRODIURIL ) 12.5 MG tablet Take 1 tablet  (12.5 mg total) by mouth daily. 30 tablet 0   lisinopril  (ZESTRIL ) 30 MG tablet Take 1 tablet (30 mg total) by mouth daily. 30 tablet 0   No facility-administered medications prior to visit.    No Known Allergies  ROS Review of Systems Negative unless indicated in HPI.    Objective:     Physical Exam Constitutional:      Appearance: Normal appearance.  HENT:     Right Ear: Tympanic membrane normal.     Left Ear: Tympanic membrane normal.     Mouth/Throat:     Mouth: Mucous membranes are moist.  Eyes:     Conjunctiva/sclera: Conjunctivae normal.     Pupils: Pupils are equal, round, and reactive to light.  Cardiovascular:     Rate and Rhythm: Normal rate and regular rhythm.     Pulses: Normal pulses.     Heart sounds: Normal heart sounds.  Pulmonary:     Effort: Pulmonary effort is normal.     Breath sounds: Normal breath sounds.  Abdominal:     General: Bowel sounds are normal.     Palpations: Abdomen is soft.  Musculoskeletal:     Cervical back: Normal range of motion. No tenderness.  Skin:    General: Skin is warm.     Findings: No bruising.  Neurological:     General: No focal deficit present.     Mental Status: He is alert and oriented to person, place, and time. Mental status is at baseline.  Psychiatric:        Mood and Affect: Mood normal.        Behavior: Behavior normal.        Thought Content: Thought content normal.        Judgment: Judgment normal.     BP 130/68   Pulse 73   Temp 97.7 F (36.5 C)   Ht 5\' 9"  (1.753 m)   Wt 202 lb (91.6 kg)   SpO2 97%   BMI 29.83 kg/m  Wt Readings from Last 3 Encounters:  02/23/24 202 lb (91.6 kg)  05/25/23 201 lb 12.8 oz (91.5 kg)  02/17/23 202 lb (91.6 kg)     Health Maintenance  Topic Date Due   COVID-19 Vaccine (7 - 2024-25 season) 06/12/2023   Medicare Annual Wellness (AWV)  02/15/2024   INFLUENZA VACCINE  05/11/2024   DTaP/Tdap/Td (2 - Td or Tdap) 11/29/2027   Colonoscopy  11/23/2032    Pneumonia Vaccine 74+ Years old  Completed   Hepatitis C Screening  Completed   Zoster Vaccines- Shingrix  Completed   HPV VACCINES  Aged Out   Meningococcal B Vaccine  Aged Out   Fecal DNA (Cologuard)  Discontinued    There are  no preventive care reminders to display for this patient.  Lab Results  Component Value Date   TSH 1.47 11/09/2021   Lab Results  Component Value Date   WBC 6.6 11/09/2021   HGB 14.9 11/09/2021   HCT 43.5 11/09/2021   MCV 95.1 11/09/2021   PLT 172.0 11/09/2021   Lab Results  Component Value Date   NA 136 03/03/2023   K 4.2 03/03/2023   CO2 27 03/03/2023   GLUCOSE 110 (H) 03/03/2023   BUN 25 (H) 03/03/2023   CREATININE 1.16 03/03/2023   BILITOT 0.9 11/22/2022   ALKPHOS 70 11/22/2022   AST 20 11/22/2022   ALT 31 11/22/2022   PROT 7.1 11/22/2022   ALBUMIN 4.4 11/22/2022   CALCIUM  8.8 03/03/2023   GFR 65.26 03/03/2023   Lab Results  Component Value Date   CHOL 154 11/22/2022   Lab Results  Component Value Date   HDL 32.60 (L) 11/22/2022   Lab Results  Component Value Date   LDLCALC 94 11/22/2022   Lab Results  Component Value Date   TRIG 138.0 11/22/2022   Lab Results  Component Value Date   CHOLHDL 5 11/22/2022   Lab Results  Component Value Date   HGBA1C 5.5 11/22/2022      Assessment & Plan:  Prediabetes Assessment & Plan: Chronic issue, diet controlled. - Continue monitoring diet and lifestyle. - Will check A1c.   Orders: -     Hemoglobin A1c  Primary hypertension Assessment & Plan: Chronic issue.   -Continue lisinopril  30 mg daily and HCTZ 12.5 mg daily.   -Will continue to monitor.   Orders: -     Lisinopril ; Take 1 tablet (30 mg total) by mouth daily.  Dispense: 90 tablet; Refill: 2 -     hydroCHLOROthiazide ; Take 1 tablet (12.5 mg total) by mouth daily.  Dispense: 90 tablet; Refill: 2 -     CBC with Differential/Platelet -     Comprehensive metabolic panel with GFR -     TSH  Hyperlipidemia,  unspecified hyperlipidemia type Assessment & Plan: Will check lipid panel.   -Continue Lipitor 40 mg daily.  Orders: -     Lipid panel  Other orders -     Atorvastatin  Calcium ; Take 1 tablet (40 mg total) by mouth daily.  Dispense: 90 tablet; Refill: 2    Follow-up: Return in about 6 months (around 08/25/2024) for chronic management.   Zahir Eisenhour, NP

## 2024-03-18 NOTE — Assessment & Plan Note (Signed)
 Chronic issue, diet controlled. - Continue monitoring diet and lifestyle. - Will check A1c.

## 2024-03-18 NOTE — Assessment & Plan Note (Signed)
 Chronic issue.   -Continue lisinopril  30 mg daily and HCTZ 12.5 mg daily.   -Will continue to monitor.

## 2024-03-18 NOTE — Assessment & Plan Note (Signed)
 Will check lipid panel.   -Continue Lipitor 40 mg daily.

## 2024-05-30 DIAGNOSIS — H6123 Impacted cerumen, bilateral: Secondary | ICD-10-CM | POA: Diagnosis not present

## 2024-05-30 DIAGNOSIS — H6063 Unspecified chronic otitis externa, bilateral: Secondary | ICD-10-CM | POA: Diagnosis not present

## 2024-06-04 ENCOUNTER — Telehealth: Payer: Self-pay

## 2024-06-04 NOTE — Telephone Encounter (Signed)
 Copied from CRM #[id number]. Topic: Appointments - Transfer of Care >> Jun 04, 2024  9:41 AM [name] wrote: Pt is requesting to transfer FROM: Gabriel Camacho Pt is requesting to transfer TO: Gabriel Camacho Reason for requested transfer: patient did not feel comfortable and and the provider did not give much input. Patient did not feel it was thorough and she did not do any lab work It is the responsibility of the team the patient would like to transfer to (Dr. Bair Kalpana) to reach out to the patient if for any reason this transfer is not acceptable.  [phone] [id number]  I spoke with patient and scheduled a TOC appointment for him with Dr. Kalpana Bair on 12/12/2024.

## 2024-06-05 NOTE — Telephone Encounter (Signed)
Is this transfer ok with you

## 2024-06-05 NOTE — Telephone Encounter (Signed)
 Okay with me

## 2024-06-05 NOTE — Telephone Encounter (Signed)
 Please schedule a TOC with Charanpreet Kaur

## 2024-08-20 DIAGNOSIS — M65342 Trigger finger, left ring finger: Secondary | ICD-10-CM | POA: Diagnosis not present

## 2024-08-29 NOTE — Progress Notes (Signed)
 "  Established Patient Office Visit  Subjective:  Patient ID: Gabriel Camacho, male    DOB: Jan 01, 1956  Age: 68 y.o. MRN: [id number]  CC:  Chief Complaint  Patient presents with   Medical Management of Chronic Issues   Discussed the use of AI scribe software for clinical note transcription with the patient, who gave verbal consent to proceed.  History of Present Illness Gabriel Camacho is a 68 year old male for routine follow up.   He has seen at  [facility] on 08/20/24 for trigger finger left hand ring finger and arthritis of small joints of the hand. He received a cortisone injection in the left ring finger and is currently taking meloxicam. The finger is feeling good and is doing well after the injection.  He experiences intermittent backaches, which are not constant. After playing golf recently, he felt fantastic but experienced pain the following morning. He occasionally takes meloxicam for this.  He is taking lisinopril  and hydrochlorothiazide  for blood pressure management, with home readings varying from 115 to 145. He is also on atorvastatin  for cholesterol and takes aspirin daily.  He has history of prediabetes will check A1c levels. No issues with constipation or diarrhea, and bowel movements are regular.  Past Medical History:  Diagnosis Date   Allergy    Chicken pox     Past Surgical History:  Procedure Laterality Date   COLONOSCOPY WITH PROPOFOL  N/A 11/23/2022   Procedure: COLONOSCOPY WITH PROPOFOL ;  Surgeon: Jinny Carmine, MD;  Location: [facility]OSCOPY;  Service: Endoscopy;  Laterality: N/A;   WRIST SURGERY Left     Family History  Problem Relation Age of Onset   Healthy Mother    Healthy Father     Social History   Socioeconomic History   Marital status: Married    Spouse name: Not on file   Number of children: Not on file   Years of education: Not on file   Highest education level: Not on file  Occupational History   Not on file  Tobacco Use   Smoking  status: Former    Current packs/day: 0.25    Types: Cigarettes   Smokeless tobacco: Never   Tobacco comments:    smokes 2 packs a week. 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   Vaping status: Never Used  Substance and Sexual Activity   Alcohol use: No    Alcohol/week: 0.0 standard drinks of alcohol   Drug use: No   Sexual activity: Not on file  Other Topics Concern   Not on file  Social History Narrative   Not on file   Social Drivers of Health   Financial Resource Strain: Low Risk  (02/15/2023)   Overall Financial Resource Strain (CARDIA)    Difficulty of Paying Living Expenses: Not hard at all  Food Insecurity: No Food Insecurity (02/15/2023)   Hunger Vital Sign    Worried About Running Out of Food in the Last Year: Never true    Ran Out of Food in the Last Year: Never true  Transportation Needs: No Transportation Needs (02/15/2023)   PRAPARE - Administrator, Civil Service (Medical): No    Lack of Transportation (Non-Medical): No  Physical Activity: Sufficiently Active (02/15/2023)   Exercise Vital Sign    Days of Exercise per Week: 7 days    Minutes of Exercise per Session: 30 min  Stress: No Stress Concern Present (02/15/2023)   [employer] of Occupational Health - Occupational Stress  Questionnaire    Feeling of Stress : Not at all  Social Connections: Socially Integrated (02/15/2023)   Social Connection and Isolation Panel    Frequency of Communication with Friends and Family: More than three times a week    Frequency of Social Gatherings with Friends and Family: More than three times a week    Attends Religious Services: More than 4 times per year    Active Member of [employer] or Organizations: Yes    Attends Engineer, Structural: More than 4 times per year    Marital Status: Married  Catering Manager Violence: Not At Risk (02/15/2023)   Humiliation, Afraid, Rape, and Kick questionnaire    Fear of Current or Ex-Partner: No    Emotionally Abused: No     Physically Abused: No    Sexually Abused: No     Outpatient Medications Prior to Visit  Medication Sig Dispense Refill   aspirin EC 81 MG tablet Take 81 mg by mouth daily. Swallow whole.     atorvastatin  (LIPITOR) 40 MG tablet Take 1 tablet (40 mg total) by mouth daily. 90 tablet 2   hydrochlorothiazide  (HYDRODIURIL ) 12.5 MG tablet Take 1 tablet (12.5 mg total) by mouth daily. 90 tablet 2   lisinopril  (ZESTRIL ) 30 MG tablet Take 1 tablet (30 mg total) by mouth daily. 90 tablet 2   meloxicam (MOBIC) 15 MG tablet Take 1 tablet by mouth daily.     No facility-administered medications prior to visit.    No Known Allergies  ROS Review of Systems Negative unless indicated in HPI.    Objective:    Physical Exam Constitutional:      Appearance: Normal appearance.  HENT:     Right Ear: There is impacted cerumen.     Left Ear: Tympanic membrane normal.     Mouth/Throat:     Mouth: Mucous membranes are moist.  Eyes:     Conjunctiva/sclera: Conjunctivae normal.     Pupils: Pupils are equal, round, and reactive to light.  Cardiovascular:     Rate and Rhythm: Normal rate and regular rhythm.     Pulses: Normal pulses.     Heart sounds: Normal heart sounds.  Pulmonary:     Effort: Pulmonary effort is normal.     Breath sounds: Normal breath sounds.  Abdominal:     General: Bowel sounds are normal.     Palpations: Abdomen is soft. There is no mass.     Tenderness: There is no guarding.  Musculoskeletal:     Cervical back: Normal range of motion. No tenderness.  Skin:    General: Skin is warm.     Findings: No bruising.  Neurological:     General: No focal deficit present.     Mental Status: He is alert and oriented to person, place, and time. Mental status is at baseline.  Psychiatric:        Mood and Affect: Mood normal.        Behavior: Behavior normal.        Thought Content: Thought content normal.        Judgment: Judgment normal.     BP 138/78   Pulse 71    Temp (!) 97.4 F (36.3 C)   Ht 5' 9 (1.753 m)   Wt 208 lb 3.2 oz (94.4 kg)   SpO2 97%   BMI 30.75 kg/m  Wt Readings from Last 3 Encounters:  08/30/24 208 lb 3.2 oz (94.4 kg)  02/23/24 202 lb (91.6 kg)  05/25/23 201 lb 12.8 oz (91.5 kg)     Health Maintenance  Topic Date Due   Medicare Annual Wellness (AWV)  02/15/2024   COVID-19 Vaccine (7 - 2025-26 season) 06/11/2024   Influenza Vaccine  01/08/2025 (Originally 05/11/2024)   DTaP/Tdap/Td (2 - Td or Tdap) 11/29/2027   Colonoscopy  11/23/2032   Pneumococcal Vaccine: 50+ Years  Completed   Hepatitis C Screening  Completed   Zoster Vaccines- Shingrix  Completed   Meningococcal B Vaccine  Aged Out   Fecal DNA (Cologuard)  Discontinued    There are no preventive care reminders to display for this patient.  Lab Results  Component Value Date   TSH 1.47 11/09/2021   Lab Results  Component Value Date   WBC 6.6 11/09/2021   HGB 14.9 11/09/2021   HCT 43.5 11/09/2021   MCV 95.1 11/09/2021   PLT 172.0 11/09/2021   Lab Results  Component Value Date   NA 136 03/03/2023   K 4.2 03/03/2023   CO2 27 03/03/2023   GLUCOSE 110 (H) 03/03/2023   BUN 25 (H) 03/03/2023   CREATININE 1.16 03/03/2023   BILITOT 0.9 11/22/2022   ALKPHOS 70 11/22/2022   AST 20 11/22/2022   ALT 31 11/22/2022   PROT 7.1 11/22/2022   ALBUMIN 4.4 11/22/2022   CALCIUM  8.8 03/03/2023   GFR 65.26 03/03/2023   Lab Results  Component Value Date   CHOL 154 11/22/2022   Lab Results  Component Value Date   HDL 32.60 (L) 11/22/2022   Lab Results  Component Value Date   LDLCALC 94 11/22/2022   Lab Results  Component Value Date   TRIG 138.0 11/22/2022   Lab Results  Component Value Date   CHOLHDL 5 11/22/2022   Lab Results  Component Value Date   HGBA1C 5.5 11/22/2022      Assessment & Plan:   Assessment & Plan Prediabetes Chronic  Lab Results  Component Value Date   HGBA1C 5.5 11/22/2022  - Will check A1c  Orders:   Hemoglobin  A1c  Primary hypertension Hypertension managed with lisinopril  30 mg and hydrochlorothiazide  12.5 mg. Home blood pressure readings vary between 115 and 145 mmHg. Advised dietary salt reduction. - Continue lisinopril  and hydrochlorothiazide . - Encourage dietary salt reduction and heart healthy diet.  Orders:   TSH   Comprehensive metabolic panel with GFR   CBC with Differential/Platelet  Hyperlipidemia, unspecified hyperlipidemia type Managed with atorvastatin . Advised to take atorvastatin  in the evening for better efficacy. - Take atorvastatin  in the evening. Lab Results  Component Value Date   CHOL 154 11/22/2022   HDL 32.60 (L) 11/22/2022   LDLCALC 94 11/22/2022   LDLDIRECT 83.0 03/11/2020   TRIG 138.0 11/22/2022   CHOLHDL 5 11/22/2022  -Will check lipid panel.  Orders:   Lipid panel  Screening PSA (prostate specific antigen)  Orders:   PSA  Trigger ring finger of left hand Trigger finger in the left ring finger treated with cortisone injection at [facility].  Symptoms improved.       Assessment and Plan Assessment & Plan   General Health Maintenance Preventive screenings up to date. Colonoscopy and tetanus vaccinations current. - Continue routine health maintenance and screenings.   Follow-up: Return in about 6 months (around 02/27/2025) for chronic management.   Chelsea Aurora, NP "

## 2024-08-29 NOTE — Assessment & Plan Note (Signed)
 Gabriel Camacho

## 2024-08-29 NOTE — Assessment & Plan Note (Signed)
 SABRA

## 2024-08-30 ENCOUNTER — Encounter: Payer: Self-pay | Admitting: Nurse Practitioner

## 2024-08-30 ENCOUNTER — Ambulatory Visit (INDEPENDENT_AMBULATORY_CARE_PROVIDER_SITE_OTHER): Payer: Self-pay | Admitting: Nurse Practitioner

## 2024-08-30 VITALS — BP 138/78 | HR 71 | Temp 97.4°F | Ht 69.0 in | Wt 208.2 lb

## 2024-08-30 DIAGNOSIS — R7303 Prediabetes: Secondary | ICD-10-CM | POA: Diagnosis not present

## 2024-08-30 DIAGNOSIS — E785 Hyperlipidemia, unspecified: Secondary | ICD-10-CM | POA: Diagnosis not present

## 2024-08-30 DIAGNOSIS — Z125 Encounter for screening for malignant neoplasm of prostate: Secondary | ICD-10-CM

## 2024-08-30 DIAGNOSIS — I10 Essential (primary) hypertension: Secondary | ICD-10-CM

## 2024-08-30 DIAGNOSIS — M65342 Trigger finger, left ring finger: Secondary | ICD-10-CM

## 2024-08-30 LAB — COMPREHENSIVE METABOLIC PANEL WITH GFR
ALT: 28 U/L (ref 0–53)
AST: 18 U/L (ref 0–37)
Albumin: 4.2 g/dL (ref 3.5–5.2)
Alkaline Phosphatase: 51 U/L (ref 39–117)
BUN: 26 mg/dL — ABNORMAL HIGH (ref 6–23)
CO2: 29 meq/L (ref 19–32)
Calcium: 9 mg/dL (ref 8.4–10.5)
Chloride: 103 meq/L (ref 96–112)
Creatinine, Ser: 1.17 mg/dL (ref 0.40–1.50)
GFR: 63.91 mL/min (ref >60.00)
Glucose, Bld: 102 mg/dL — ABNORMAL HIGH (ref 70–99)
Potassium: 4.6 meq/L (ref 3.5–5.1)
Sodium: 137 meq/L (ref 135–145)
Total Bilirubin: 0.8 mg/dL (ref 0.2–1.2)
Total Protein: 6.6 g/dL (ref 6.0–8.3)

## 2024-08-30 LAB — TSH: TSH: 1.15 u[IU]/mL (ref 0.35–5.50)

## 2024-08-30 LAB — CBC WITH DIFFERENTIAL/PLATELET
Basophils Absolute: 0.1 K/uL (ref 0.0–0.1)
Basophils Relative: 0.8 % (ref 0.0–3.0)
Eosinophils Absolute: 0.2 K/uL (ref 0.0–0.7)
Eosinophils Relative: 3.3 % (ref 0.0–5.0)
HCT: 43.1 % (ref 39.0–52.0)
Hemoglobin: 14.8 g/dL (ref 13.0–17.0)
Lymphocytes Relative: 29.1 % (ref 12.0–46.0)
Lymphs Abs: 2 K/uL (ref 0.7–4.0)
MCHC: 34.3 g/dL (ref 30.0–36.0)
MCV: 96.3 fl (ref 78.0–100.0)
Monocytes Absolute: 0.6 K/uL (ref 0.1–1.0)
Monocytes Relative: 8.6 % (ref 3.0–12.0)
Neutro Abs: 4 K/uL (ref 1.4–7.7)
Neutrophils Relative %: 58.2 % (ref 43.0–77.0)
Platelets: 155 K/uL (ref 150.0–400.0)
RBC: 4.48 Mil/uL (ref 4.22–5.81)
RDW: 13.1 % (ref 11.5–15.5)
WBC: 6.8 K/uL (ref 4.0–10.5)

## 2024-08-30 LAB — LIPID PANEL
Cholesterol: 128 mg/dL (ref 0–200)
HDL: 36.5 mg/dL — ABNORMAL LOW (ref >39.00)
LDL Cholesterol: 72 mg/dL (ref 0–99)
NonHDL: 91.83
Total CHOL/HDL Ratio: 4
Triglycerides: 100 mg/dL (ref 0.0–149.0)
VLDL: 20 mg/dL (ref 0.0–40.0)

## 2024-08-30 LAB — HEMOGLOBIN A1C: Hgb A1c MFr Bld: 5.5 % (ref 4.6–6.5)

## 2024-08-30 LAB — PSA: PSA: 0.95 ng/mL (ref 0.10–4.00)

## 2024-08-30 NOTE — Assessment & Plan Note (Addendum)
 Trigger finger in the left ring finger treated with cortisone injection at [facility].  Symptoms improved.

## 2024-08-30 NOTE — Patient Instructions (Signed)
 Due for Medical Annual Wellness visit please schedule that during check out.

## 2024-09-02 ENCOUNTER — Ambulatory Visit: Payer: Self-pay | Admitting: Nurse Practitioner

## 2024-09-02 NOTE — Progress Notes (Signed)
 Please inform pt:  Cholesterol well-controlled. Electrolytes, liver function, A1c , PSA and thyroid  stable. Kidney functions are stable with slightly elevated BUN  can be related to dehydration should increase water intake.

## 2024-09-03 NOTE — Progress Notes (Signed)
 LVM stating for pt to call office back to receive results. When pt calls back ok to give results and document

## 2024-12-12 ENCOUNTER — Encounter

## 2025-01-09 ENCOUNTER — Ambulatory Visit

## 2025-03-01 ENCOUNTER — Ambulatory Visit: Admitting: Nurse Practitioner
